# Patient Record
Sex: Female | Born: 2000 | Race: Asian | Hispanic: No | Marital: Single | Smoking: Current some day smoker
Health system: Southern US, Community
[De-identification: ages and names within clinical notes are randomized; demographics above are authoritative.]

## PROBLEM LIST (undated history)

## (undated) VITALS — BP 115/79 | HR 94 | Temp 98.4°F | Resp 18 | Ht 64.0 in | Wt 164.0 lb

---
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
## 2021-07-14 ENCOUNTER — Other Ambulatory Visit: Payer: Self-pay

## 2021-07-14 ENCOUNTER — Inpatient Hospital Stay (HOSPITAL_COMMUNITY)
Admission: RE | Admit: 2021-07-14 | Discharge: 2021-07-21 | DRG: 885 | Disposition: A | Discharge status: Home / Self Care | Payer: BLUE CROSS/BLUE SHIELD | Attending: Psychiatry | Admitting: Psychiatry

## 2021-07-14 ENCOUNTER — Encounter (HOSPITAL_COMMUNITY): Payer: Self-pay | Admitting: Psychiatry

## 2021-07-14 ENCOUNTER — Other Ambulatory Visit: Payer: Self-pay | Admitting: Psychiatry

## 2021-07-14 DIAGNOSIS — Z62810 Personal history of physical and sexual abuse in childhood: Secondary | ICD-10-CM | POA: Diagnosis present

## 2021-07-14 DIAGNOSIS — Z5986 Financial insecurity: Secondary | ICD-10-CM

## 2021-07-14 DIAGNOSIS — J45909 Unspecified asthma, uncomplicated: Secondary | ICD-10-CM | POA: Diagnosis present

## 2021-07-14 DIAGNOSIS — R45851 Suicidal ideations: Secondary | ICD-10-CM | POA: Diagnosis present

## 2021-07-14 DIAGNOSIS — F410 Panic disorder [episodic paroxysmal anxiety] without agoraphobia: Secondary | ICD-10-CM | POA: Diagnosis not present

## 2021-07-14 DIAGNOSIS — Z20822 Contact with and (suspected) exposure to covid-19: Secondary | ICD-10-CM | POA: Diagnosis present

## 2021-07-14 DIAGNOSIS — F603 Borderline personality disorder: Secondary | ICD-10-CM | POA: Diagnosis present

## 2021-07-14 DIAGNOSIS — Z823 Family history of stroke: Secondary | ICD-10-CM | POA: Diagnosis not present

## 2021-07-14 DIAGNOSIS — G4710 Hypersomnia, unspecified: Secondary | ICD-10-CM | POA: Diagnosis present

## 2021-07-14 DIAGNOSIS — G4700 Insomnia, unspecified: Secondary | ICD-10-CM | POA: Diagnosis present

## 2021-07-14 DIAGNOSIS — Z634 Disappearance and death of family member: Secondary | ICD-10-CM | POA: Diagnosis not present

## 2021-07-14 DIAGNOSIS — F250 Schizoaffective disorder, bipolar type: Principal | ICD-10-CM | POA: Diagnosis present

## 2021-07-14 DIAGNOSIS — F17210 Nicotine dependence, cigarettes, uncomplicated: Secondary | ICD-10-CM | POA: Diagnosis present

## 2021-07-14 DIAGNOSIS — E785 Hyperlipidemia, unspecified: Secondary | ICD-10-CM | POA: Diagnosis not present

## 2021-07-14 DIAGNOSIS — R41843 Psychomotor deficit: Secondary | ICD-10-CM | POA: Diagnosis present

## 2021-07-14 DIAGNOSIS — Z833 Family history of diabetes mellitus: Secondary | ICD-10-CM

## 2021-07-14 LAB — RESP PANEL BY RT-PCR (FLU A&B, COVID) ARPGX2
Influenza A by PCR: NEGATIVE
Influenza B by PCR: NEGATIVE
SARS Coronavirus 2 by RT PCR: NEGATIVE

## 2021-07-14 MED ORDER — ARIPIPRAZOLE 15 MG PO TABS
15.0000 mg | ORAL_TABLET | Freq: Every day | ORAL | Status: DC
  Administered 2021-07-14: 15 mg via ORAL
  Filled 2021-07-14: qty 1

## 2021-07-14 MED ORDER — TRAZODONE HCL 50 MG PO TABS
50.0000 mg | ORAL_TABLET | Freq: Every day | ORAL | Status: DC
  Administered 2021-07-14 – 2021-07-16 (×3): 50 mg via ORAL
  Filled 2021-07-14 (×4): qty 1

## 2021-07-14 MED ORDER — ARIPIPRAZOLE 15 MG PO TABS
ORAL_TABLET | ORAL | Status: AC
  Filled 2021-07-14: qty 1

## 2021-07-14 MED ORDER — MAGNESIUM HYDROXIDE 400 MG/5ML PO SUSP: 30.0000 mL | Freq: Every day | ORAL | Status: DC | PRN

## 2021-07-14 MED ORDER — HYDROXYZINE HCL 25 MG PO TABS
25.0000 mg | ORAL_TABLET | Freq: Three times a day (TID) | ORAL | Status: DC | PRN
  Administered 2021-07-15 – 2021-07-21 (×6): 25 mg via ORAL
  Filled 2021-07-14 (×6): qty 1
  Filled 2021-07-14: qty 21

## 2021-07-14 MED ORDER — ACETAMINOPHEN 325 MG PO TABS
650.0000 mg | ORAL_TABLET | Freq: Four times a day (QID) | ORAL | Status: DC | PRN
  Administered 2021-07-17 – 2021-07-18 (×2): 650 mg via ORAL
  Filled 2021-07-14 (×2): qty 2

## 2021-07-14 MED ORDER — NORGESTIM-ETH ESTRAD TRIPHASIC 0.18/0.215/0.25 MG-25 MCG PO TABS
1.0000 | ORAL_TABLET | Freq: Every day | ORAL | Status: DC
  Administered 2021-07-14 – 2021-07-15 (×2): 1 via ORAL

## 2021-07-14 NOTE — Progress Notes (Signed)
[facility] Group Notes:  (Nursing/MHT/Case Management/Adjunct)  Date:  07/14/2021  Time:  2015  Type of Therapy:   wrap up group  Participation Level:  Active  Participation Quality:  Appropriate, Attentive, Sharing, and Supportive  Affect:  Anxious  Cognitive:  Alert  Insight:  Improving  Engagement in Group:  Engaged  Modes of Intervention:  Clarification, Education, and Support  Summary of Progress/Problems: Positive thinking and positive change were discussed.   Marcille Buffy 07/14/2021, 9:36 PM

## 2021-07-14 NOTE — H&P (Signed)
Behavioral Health Medical Screening Exam  Megan Dunn is an 20 y.o. female with past history of schizoaffective disorder bipolar type, dissociative episodes, major depression disorder, AVH, inpatient hospitalization Megan Dunn 11/16/19). Currently receives outpatient psychiatric services via [facility] ([place]); Abilify 15 mg daily.   Per chart review, patient sent message to outpatient mental health provider stating she was "unwell mentally and we need to have them go to her house to check on her"; non-emergency line contacted to have officer to patient's residence for a well check. Patient didn't respond to email or calls from provider following wellness checks yesterday or today.   On assessment patient anxious and tearful. States she's not been able to get out of bed and unable to function. Patient is currently unemployed and states she is "struggling" to find employment due to her current mental state. States she has been thinking about listening to the voices telling her to kill herself. Patient states she does not feel comfortable alone and at home; unable to contract for safety. She is tearful and unable to answer many assessment questions; provided verbal permission for provider to speak with sister and boyfriend who are in the lobby for collateral information.  Collateral:  Sister Megan Dunn:  States she currently lives in [place] but patient has had "issues" since high school but initially when mother died while patient was in 5th grade. Says over Thanksgiving break she noticed patient was more "sensitive" and having several "breakdowns". Says at baseline patient is an extrovert with "bubbly" personality and large group of friends. Doesn't communicate well regarding mental health; family expectations are heavy on her. Patient has been experiencing paranoia where she believes things are in her closet. Feels very concerned given patient's current  presentation.   Boyfriend Megan Dunn:  States patient has been having self harm behaviors including scratching skin. Gets "really bad thoughts" when patient is alone; has safety concerns for patient at home. Patient has recently been saying she "wasn't sure she was going to make to Christmas". Endorses medication compliance; confirms x2 wellness checks by law enforcement as a result of providers contact. Says patient refused to contact provider after the fact.   Total Time spent with patient: 20 minutes  Psychiatric Specialty Exam: Physical Exam Review of Systems Blood pressure 113/72, pulse 78, resp. rate 18, SpO2 100 %.There is no height or weight on file to calculate BMI. General Appearance: Disheveled Eye Contact:  Fair Speech:  Normal Rate Volume:  Normal Mood:  Anxious and Hopeless Affect:  Depressed and Tearful Thought Process:  Coherent Orientation:  Full (Time, Place, and Person) Thought Content:  Paranoid Ideation Suicidal Thoughts:  Yes.  without intent/plan Homicidal Thoughts:  No Memory:  Immediate;   Fair Recent;   Fair Judgement:  Impaired Insight:  Present and Shallow Psychomotor Activity:  Increased Concentration: Concentration: Poor and Attention Span: Poor Recall:  Poor Fund of Knowledge:Good Language: Fair Akathisia:  NA Handed:  Right AIMS (if indicated):    Assets:  Desire for Improvement Financial Resources/Insurance Housing Intimacy Leisure Time Physical Health Resilience Social Support Sleep:     Musculoskeletal: Strength & Muscle Tone: within normal limits Gait & Station: normal Patient leans: N/A  Blood pressure 113/72, pulse 78, resp. rate 18, SpO2 100 %.  Recommendations: Based on my evaluation the patient does not appear to have an emergency medical condition. Patient recommended for inpatient hospitalization for further observation, stabilization, and treatment; accepted to [facility].   Loletta Parish, NP 07/14/2021, 3:52  PM

## 2021-07-14 NOTE — Progress Notes (Signed)
Patient is a 20 year old female who presented to [facility] as a walk-in accompanied by her boyfriend. Pt reported having an increase in suicidal ideation, and stated "I couldn't get out of bed throughout the day". Pt reported an increase in AH (command) and VH (shadowy figure that she calls "[place]"). Pt has a past hx of schizoaffective disorder bipolar type, and reports taking Abilify daily. Patient presented as childlike- becoming tearful when told she couldn't have her stuffed animal- was calm, and cooperative during admission interview and assessment. VS monitored and recorded. Skin check performed - old scars on right forearm from scratching herself.Admission paperwork completed and signed. Pt denied SI/HI and A/VH at the time of admission interview. Patient was oriented to unit and schedule. Pt provided with dinner . Q 15 min checks initiated for safety.

## 2021-07-14 NOTE — Tx Team (Signed)
Initial Treatment Plan 07/14/2021 8:45 PM Megan Dunn RFX:[id number]    PATIENT STRESSORS: Marital or family conflict   Other: mental heatlh     PATIENT STRENGTHS: Average or above average intelligence  Motivation for treatment/growth  Physical Health  Supportive family/friends    PATIENT IDENTIFIED PROBLEMS: Suicidal ideation  Self harm behaviors  A/V H    Increased depression             DISCHARGE CRITERIA:  Adequate post-discharge living arrangements Need for constant or close observation no longer present Reduction of life-threatening or endangering symptoms to within safe limits Verbal commitment to aftercare and medication compliance  PRELIMINARY DISCHARGE PLAN: Outpatient therapy Return to previous living arrangement  PATIENT/FAMILY INVOLVEMENT: This treatment plan has been presented to and reviewed with the patient, Megan Dunn,  The patient has been given the opportunity to ask questions and make suggestions.  Shela Nevin, RN 07/14/2021, 8:45 PM

## 2021-07-15 DIAGNOSIS — F250 Schizoaffective disorder, bipolar type: Principal | ICD-10-CM

## 2021-07-15 LAB — CBC
HCT: 41 % (ref 36.0–46.0)
Hemoglobin: 13 g/dL (ref 12.0–15.0)
MCH: 27 pg (ref 26.0–34.0)
MCHC: 31.7 g/dL (ref 30.0–36.0)
MCV: 85.2 fL (ref 80.0–100.0)
Platelets: 389 10*3/uL (ref 150–400)
RBC: 4.81 MIL/uL (ref 3.87–5.11)
RDW: 14 % (ref 11.5–15.5)
WBC: 12.3 10*3/uL — ABNORMAL HIGH (ref 4.0–10.5)
nRBC: 0 % (ref 0.0–0.2)

## 2021-07-15 LAB — COMPREHENSIVE METABOLIC PANEL
ALT: 12 U/L (ref 0–44)
AST: 14 U/L — ABNORMAL LOW (ref 15–41)
Albumin: 4.2 g/dL (ref 3.5–5.0)
Alkaline Phosphatase: 59 U/L (ref 38–126)
Anion gap: 8 (ref 5–15)
BUN: 12 mg/dL (ref 6–20)
CO2: 22 mmol/L (ref 22–32)
Calcium: 9.1 mg/dL (ref 8.9–10.3)
Chloride: 104 mmol/L (ref 98–111)
Creatinine, Ser: 0.72 mg/dL (ref 0.44–1.00)
GFR, Estimated: >60 mL/min (ref >60)
Glucose, Bld: 98 mg/dL (ref 70–99)
Potassium: 3.5 mmol/L (ref 3.5–5.1)
Sodium: 134 mmol/L — ABNORMAL LOW (ref 135–145)
Total Bilirubin: 0.4 mg/dL (ref 0.3–1.2)
Total Protein: 7.8 g/dL (ref 6.5–8.1)

## 2021-07-15 LAB — LIPID PANEL
Cholesterol: 214 mg/dL — ABNORMAL HIGH (ref 0–200)
HDL: 75 mg/dL (ref >40)
LDL Cholesterol: 107 mg/dL — ABNORMAL HIGH (ref 0–99)
Total CHOL/HDL Ratio: 2.9 RATIO
Triglycerides: 160 mg/dL — ABNORMAL HIGH (ref <150)
VLDL: 32 mg/dL (ref 0–40)

## 2021-07-15 LAB — RAPID URINE DRUG SCREEN, HOSP PERFORMED
Amphetamines: NOT DETECTED
Barbiturates: NOT DETECTED
Benzodiazepines: NOT DETECTED
Cocaine: NOT DETECTED
Opiates: NOT DETECTED
Tetrahydrocannabinol: NOT DETECTED

## 2021-07-15 LAB — TSH: TSH: 3.575 u[IU]/mL (ref 0.350–4.500)

## 2021-07-15 LAB — PREGNANCY, URINE: Preg Test, Ur: NEGATIVE

## 2021-07-15 MED ORDER — NICOTINE 21 MG/24HR TD PT24
21.0000 mg | MEDICATED_PATCH | Freq: Every day | TRANSDERMAL | Status: DC
  Administered 2021-07-15: 08:00:00 21 mg via TRANSDERMAL
  Filled 2021-07-15 (×2): qty 1

## 2021-07-15 MED ORDER — ZIPRASIDONE HCL 20 MG PO CAPS
20.0000 mg | ORAL_CAPSULE | Freq: Two times a day (BID) | ORAL | Status: DC
  Administered 2021-07-15 – 2021-07-18 (×6): 20 mg via ORAL
  Filled 2021-07-15 (×10): qty 1

## 2021-07-15 NOTE — Progress Notes (Addendum)
   07/15/21 1400  Psych Admission Type (Psych Patients Only)  Admission Status Voluntary  Psychosocial Assessment  Patient Complaints Anxiety;Depression  Eye Contact Brief  Facial Expression Flat  Affect Appropriate to circumstance  Speech Logical/coherent  Interaction Assertive  Motor Activity  (steady gait)  Appearance/Hygiene Unremarkable  Behavior Characteristics Cooperative;Appropriate to situation  Thought Process  Coherency WDL  Content WDL  Delusions None reported or observed  Perception WDL  Hallucination None reported or observed  Judgment Poor  Confusion None  Danger to Self  Current suicidal ideation? Denies  Danger to Others  Danger to Others None reported or observed     D. Pt presents with a sad affect/ anxious mood- calm, cooperative behavior on the unit. Pt currently denies SI/HI and AVH and agrees to contact staff before acting on any harmful thoughts. Per pt's self inventory, pt rated her depression, hopelessness and anxiety a 7/6/7, respectively. Pt reported that her goal today was to work on "being honest with my doctor and being confident".  A. Labs and vitals monitored. Pt given and educated on medications. Pt supported emotionally and encouraged to express concerns and ask questions.   R. Pt remains safe with 15 minute checks. Will continue POC.

## 2021-07-15 NOTE — [facility] Group Notes (Signed)
Adult Psychoeducational Group Not Date:  07/15/2021 Time:  0900-1045 Group Topic/Focus: PROGRESSIVE RELAXATION. A group where deep breathing is taught and tensing and relaxation muscle groups is used. Imagery is used as well.  Pts are asked to imagine 3 pillars that hold them up when they are not able to hold themselves up.  Participation Level:  Active  Participation Quality:  Appropriate  Affect:  Appropriate  Cognitive:  Oriented  Insight: Improving  Engagement in Group:  Engaged  Modes of Intervention:  Activity, Discussion, Education, and Support  Additional Comments:  Rates her energy level at a 4/10. States what holds her up is her Boyfriend her friends and her good faith in humanity  Megan Dunn, Megan Dunn A

## 2021-07-15 NOTE — Progress Notes (Signed)
   07/15/21 2334  Psych Admission Type (Psych Patients Only)  Admission Status Voluntary  Psychosocial Assessment  Patient Complaints Anxiety  Eye Contact Fair  Facial Expression Flat  Affect Appropriate to circumstance  Speech Logical/coherent;Soft  Interaction Assertive  Motor Activity Other (Comment) (WDL)  Appearance/Hygiene Unremarkable  Behavior Characteristics Appropriate to situation  Mood Anxious;Pleasant  Thought Process  Coherency WDL  Content WDL  Delusions None reported or observed  Perception WDL  Hallucination None reported or observed  Judgment Poor  Confusion None  Danger to Self  Current suicidal ideation? Denies  Danger to Others  Danger to Others None reported or observed

## 2021-07-15 NOTE — Group Note (Signed)
[facility] LCSW Group Therapy Note  Date/Time:  07/15/2021 10:00-11:00AM  Type of Therapy and Topic:  Group Therapy:  Healthy and Unhealthy Supports plus being "My Own Hero"  Participation Level:  Active   Description of Group: Patients in this group were invited to identify the differences between healthy and unhealthy supports and then to identify those people in their lives who fall into one category or the other, as well as why.  They were then introduced to the idea of adding more healthy supports and decreasing the unhealthy ones.  Patients discussed what additional healthy supports could be helpful in their recovery and wellness after discharge in order to maintain stability.   An emphasis was placed on using counselor, doctor, therapy groups, 12-step groups, and problem-specific support groups to expand supports.  The song "My Own Hero" was played to encourage full participation in their own recovery journey instead of expecting others to do all the work for them.  The song "I Know Where I've Been" was played as further encouragement that they are further in their journey than they were previously.  Therapeutic Goals:   1)  discuss importance of adding supports to stay well once out of the hospital  2)  compare healthy versus unhealthy supports and identify some examples of each  3)  generate ideas and descriptions of healthy supports that can be added  4)  offer mutual support about how to address unhealthy supports  5)  encourage active participation in and adherence to discharge plan    Summary of Patient Progress:  The patient stated that current healthy supports in their life are their friends and partner while current unhealthy supports include "picking her skin" and vaping. The patient expressed a willingness to add appropriate support(s) to help in their recovery journey.   Therapeutic Modalities:   Motivational Interviewing Brief Solution-Focused Therapy  Steve Rattler,  [place] 07/15/2021 1:58 PM

## 2021-07-15 NOTE — [facility] Suicide Risk Assessment (Signed)
[facility] Admission Suicide Risk Assessment   Nursing information obtained from:  Patient Demographic factors:  Adolescent or young adult, Caucasian, Low socioeconomic status, Unemployed Current Mental Status:  Suicidal ideation indicated by patient, Self-harm behaviors Loss Factors:  Decrease in vocational status Historical Factors:  Impulsivity Risk Reduction Factors:  Positive social support  Total Time spent with patient: 30 minutes Principal Problem: Schizoaffective disorder, bipolar type ([place]) Diagnosis:  Principal Problem:   Schizoaffective disorder, bipolar type ([place])  Subjective Data: Megan Dunn is a 20 year old with a past psychiatric history of schizoaffective disorder who presented as a voluntary walking to [facility] endorsing worsening mood as well as auditory and visual hallucinations.  She had to quit her job because the hallucinations were interfering with her ability to perform, and she has not been able to function enough to get another one. She is worried about making her bills. She is not sleeping well. She notes that this happens about the same time of year every year. She has been seeing shadows since she was a child and named them "Mechele Claude" to make them less scary. She hears voices of a crowd but they are more negative and tell her to do bad things. She denies thoughts of wanting to die. She worries that the air molecules will change and she will choke to death.   Continued Clinical Symptoms:    The "Alcohol Use Disorders Identification Test", Guidelines for Use in Primary Care, Second Edition.  World Pharmacologist [facility]). Score between 0-7:  no or low risk or alcohol related problems. Score between 8-15:  moderate risk of alcohol related problems. Score between 16-19:  high risk of alcohol related problems. Score 20 or above:  warrants further diagnostic evaluation for alcohol dependence and treatment.   CLINICAL FACTORS:   Schizophrenia:   Command  hallucinatons Depressive state Less than 18 years old More than one psychiatric diagnosis Currently Psychotic   Musculoskeletal: Strength & Muscle Tone: within normal limits Gait & Station: normal Patient leans: N/A  Psychiatric Specialty Exam:  Presentation  General Appearance: Appropriate for Environment  Eye Contact:Fleeting  Speech:Clear and Coherent  Speech Volume:Decreased  Handedness:No data recorded  Mood and Affect  Mood:Dysphoric  Affect:Flat   Thought Process  Thought Processes:Coherent  Descriptions of Associations:Intact  Orientation:Full (Time, Place and Person)  Thought Content:Logical  History of Schizophrenia/Schizoaffective disorder:No data recorded Duration of Psychotic Symptoms:No data recorded Hallucinations:Hallucinations: Auditory; Visual Description of Auditory Hallucinations: voices having conversation and sometimes tell her to do stuff Description of Visual Hallucinations: see's a shadowy figure she has named Mechele Claude, last saw yesterday  Ideas of Reference:None  Suicidal Thoughts:Suicidal Thoughts: No  Homicidal Thoughts:Homicidal Thoughts: No   Sensorium  Memory:Immediate Good; Recent Good; Remote Good  Judgment:Fair  Insight:Shallow   Executive Functions  Concentration:Good  Attention Span:Fair  [place]  Language:Good   Psychomotor Activity  Psychomotor Activity:Psychomotor Activity: Decreased   Assets  Assets:Communication Skills; Housing; Data processing manager; Resilience; Intimacy   Sleep  Sleep:Sleep: Poor    Physical Exam: Physical Exam Vitals and nursing note reviewed.  Constitutional:      Appearance: Normal appearance.  HENT:     Head: Normocephalic.  Eyes:     Extraocular Movements: Extraocular movements intact.  Pulmonary:     Effort: Pulmonary effort is normal.  Musculoskeletal:        General: Normal range of motion.     Cervical back: Normal range  of motion.  Neurological:     General: No  focal deficit present.     Mental Status: She is alert and oriented to person, place, and time.  Psychiatric:        Attention and Perception: Attention normal. She perceives auditory and visual hallucinations.        Mood and Affect: Mood is depressed. Affect is blunt.        Speech: Speech normal.        Behavior: Behavior normal. Behavior is cooperative.        Thought Content: Thought content is paranoid and delusional. Thought content does not include homicidal or suicidal ideation. Thought content does not include homicidal or suicidal plan.        Cognition and Memory: Cognition and memory normal.        Judgment: Judgment is impulsive.   Review of Systems  Constitutional:  Negative for fever.  HENT:  Negative for hearing loss.   Eyes:  Negative for blurred vision.  Respiratory:  Negative for cough.   Cardiovascular:  Negative for chest pain.  Gastrointestinal:  Negative for constipation, diarrhea, nausea and vomiting.  Genitourinary:  Negative for dysuria.  Musculoskeletal:  Negative for myalgias.  Skin:  Negative for rash.  Neurological:  Positive for dizziness.  Psychiatric/Behavioral:  Positive for depression and hallucinations. Negative for memory loss, substance abuse and suicidal ideas. The patient is nervous/anxious and has insomnia.   Blood pressure 125/80, pulse 91, temperature 98.2 F (36.8 C), temperature source Oral, resp. rate 16, height 5\' 4"  (1.626 m), weight 74.4 kg, SpO2 100 %. Body mass index is 28.15 kg/m.   COGNITIVE FEATURES THAT CONTRIBUTE TO RISK:  None    SUICIDE RISK:   Moderate:  Frequent suicidal ideation with limited intensity, and duration, some specificity in terms of plans, no associated intent, good self-control, limited dysphoria/symptomatology, some risk factors present, and identifiable protective factors, including available and accessible social support.  PLAN OF CARE: I have read below and  discuss with resident. Edits made to reflect my own interview and interactions with patient today.   Safety and Monitoring --  Admission to inpatient psychiatric unit for safety, stabilization and treatment -- Daily contact with patient to assess and evaluate symptoms and progress in treatment -- Patient's case to be discussed in multi-disciplinary team meeting. -- Patient will be encouraged to participate in the therapeutic group milieu. -- Observation Level : q15 minute checks -- Vital signs:  q12 hours -- Precautions: suicide.  Plan  -Monitor Vitals. -Monitor for thoughts of harm to self or others -Monitor for psychosis, disorganization or changes to cognition -Monitor for withdrawal symptoms. -Monitor for medication side effects.  Labs/Studies: Lipids, A1c  Medications: Trial of Geodon. Patient has failed Abilify. Will try Geodon (less weight gain) for mood stabilization and psychosis PRNS for anxiety, sleep, indigestion Continue birst control NRT for smoking cessation   I certify that inpatient services furnished can reasonably be expected to improve the patient's condition.   , MD 07/15/2021, 2:57 PM
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
## 2021-07-15 NOTE — Progress Notes (Signed)
   07/15/21 0018  Psych Admission Type (Psych Patients Only)  Admission Status Voluntary  Psychosocial Assessment  Patient Complaints Anxiety  Eye Contact Brief  Facial Expression Flat  Affect Appropriate to circumstance  Speech Logical/coherent  Interaction Assertive  Appearance/Hygiene Unremarkable  Behavior Characteristics Appropriate to situation  Mood Anxious  Thought Process  Coherency WDL  Content WDL  Delusions None reported or observed  Perception WDL  Hallucination None reported or observed  Judgment Poor  Confusion None  Danger to Self  Current suicidal ideation? Denies  Danger to Others  Danger to Others None reported or observed

## 2021-07-16 ENCOUNTER — Encounter (HOSPITAL_COMMUNITY): Payer: Self-pay

## 2021-07-16 LAB — HEMOGLOBIN A1C
Hgb A1c MFr Bld: 5.4 % (ref 4.8–5.6)
Mean Plasma Glucose: 108.28 mg/dL

## 2021-07-16 MED ORDER — NORGESTIM-ETH ESTRAD TRIPHASIC 0.18/0.215/0.25 MG-25 MCG PO TABS
1.0000 | ORAL_TABLET | Freq: Every day | ORAL | Status: DC
Start: 2021-07-16 — End: 2021-07-21
  Administered 2021-07-16 – 2021-07-20 (×5): 1 via ORAL

## 2021-07-16 MED ORDER — NICOTINE POLACRILEX 2 MG MT GUM: 2.0000 mg | CHEWING_GUM | OROMUCOSAL | Status: DC | PRN

## 2021-07-16 NOTE — Plan of Care (Signed)
  Problem: Education: Goal: Knowledge of [facility] Education information/materials will improve Outcome: Progressing Goal: Emotional status will improve Outcome: Progressing Goal: Mental status will improve Outcome: Progressing   

## 2021-07-16 NOTE — BH IP Treatment Plan (Signed)
Interdisciplinary Treatment and Diagnostic Plan Update  07/16/2021 Time of Session: 9:55am Megan Dunn MRN: [id number]  Principal Diagnosis: Schizoaffective disorder, bipolar type ([place])  Secondary Diagnoses: Principal Problem:   Schizoaffective disorder, bipolar type ([place])   Current Medications:  Current Facility-Administered Medications  Medication Dose Route Frequency Provider Last Rate Last Admin   acetaminophen (TYLENOL) tablet 650 mg  650 mg Oral Q6H PRN Bobbitt, Shalon E, NP       hydrOXYzine (ATARAX) tablet 25 mg  25 mg Oral TID PRN Bobbitt, Shalon E, NP   25 mg at 07/15/21 2234   magnesium hydroxide (MILK OF MAGNESIA) suspension 30 mL  30 mL Oral Daily PRN Leevy-Johnson, Brooke A, NP       nicotine polacrilex (NICORETTE) gum 2 mg  2 mg Oral PRN Freida Busman, MD       Norgestimate-Ethinyl Estradiol Triphasic 0.18/0.215/0.25 MG-25 MCG tablet 1 tablet  1 tablet Oral Daily Bobbitt, Shalon E, NP   1 tablet at 07/15/21 2142   traZODone (DESYREL) tablet 50 mg  50 mg Oral QHS Bobbitt, Shalon E, NP   50 mg at 07/15/21 2141   ziprasidone (GEODON) capsule 20 mg  20 mg Oral BID WC Damita Dunnings B, MD   20 mg at 07/15/21 1811   PTA Medications: Medications Prior to Admission  Medication Sig Dispense Refill Last Dose   albuterol (VENTOLIN HFA) 108 (90 Base) MCG/ACT inhaler Inhale 2 puffs into the lungs every 6 (six) hours as needed.      ARIPiprazole (ABILIFY) 15 MG tablet Take 1 tablet by mouth daily.      Norgestimate-Ethinyl Estradiol Triphasic 0.18/0.215/0.25 MG-25 MCG tab Take 1 tablet by mouth daily.       Patient Stressors: Marital or family conflict   Other: mental heatlh    Patient Strengths: Average or above average intelligence  Motivation for treatment/growth  Physical Health  Supportive family/friends   Treatment Modalities: Medication Management, Group therapy, Case management,  1 to 1 session with clinician, Psychoeducation, Recreational therapy.   Physician  Treatment Plan for Primary Diagnosis: Schizoaffective disorder, bipolar type ([place]) Long Term Goal(s): Improvement in symptoms so as ready for discharge   Short Term Goals: Ability to identify changes in lifestyle to reduce recurrence of condition will improve Ability to verbalize feelings will improve Ability to disclose and discuss suicidal ideas Ability to demonstrate self-control will improve Ability to identify and develop effective coping behaviors will improve Ability to maintain clinical measurements within normal limits will improve Ability to identify triggers associated with substance abuse/mental health issues will improve  Medication Management: Evaluate patient's response, side effects, and tolerance of medication regimen.  Therapeutic Interventions: 1 to 1 sessions, Unit Group sessions and Medication administration.  Evaluation of Outcomes: Not Met  Physician Treatment Plan for Secondary Diagnosis: Principal Problem:   Schizoaffective disorder, bipolar type ([place])  Long Term Goal(s): Improvement in symptoms so as ready for discharge   Short Term Goals: Ability to identify changes in lifestyle to reduce recurrence of condition will improve Ability to verbalize feelings will improve Ability to disclose and discuss suicidal ideas Ability to demonstrate self-control will improve Ability to identify and develop effective coping behaviors will improve Ability to maintain clinical measurements within normal limits will improve Ability to identify triggers associated with substance abuse/mental health issues will improve     Medication Management: Evaluate patient's response, side effects, and tolerance of medication regimen.  Therapeutic Interventions: 1 to 1 sessions, Unit Group sessions and Medication administration.  Evaluation  of Outcomes: Not Met   RN Treatment Plan for Primary Diagnosis: Schizoaffective disorder, bipolar type ([place]) Long Term Goal(s): Knowledge of  disease and therapeutic regimen to maintain health will improve  Short Term Goals: Ability to remain free from injury will improve, Ability to verbalize frustration and anger appropriately will improve, Ability to demonstrate self-control, Ability to identify and develop effective coping behaviors will improve, and Compliance with prescribed medications will improve  Medication Management: RN will administer medications as ordered by provider, will assess and evaluate patient's response and provide education to patient for prescribed medication. RN will report any adverse and/or side effects to prescribing provider.  Therapeutic Interventions: 1 on 1 counseling sessions, Psychoeducation, Medication administration, Evaluate responses to treatment, Monitor vital signs and CBGs as ordered, Perform/monitor CIWA, COWS, AIMS and Fall Risk screenings as ordered, Perform wound care treatments as ordered.  Evaluation of Outcomes: Not Met   LCSW Treatment Plan for Primary Diagnosis: Schizoaffective disorder, bipolar type ([place]) Long Term Goal(s): Safe transition to appropriate next level of care at discharge, Engage patient in therapeutic group addressing interpersonal concerns.  Short Term Goals: Engage patient in aftercare planning with referrals and resources, Increase social support, Increase ability to appropriately verbalize feelings, Increase emotional regulation, Identify triggers associated with mental health/substance abuse issues, and Increase skills for wellness and recovery  Therapeutic Interventions: Assess for all discharge needs, 1 to 1 time with Social worker, Explore available resources and support systems, Assess for adequacy in community support network, Educate family and significant other(s) on suicide prevention, Complete Psychosocial Assessment, Interpersonal group therapy.  Evaluation of Outcomes: Not Met   Progress in Treatment: Attending groups: Yes. Participating in groups:  Yes. Taking medication as prescribed: Yes. Toleration medication: Yes. Family/Significant other contact made: No, will contact:  boyfriend Patient understands diagnosis: Yes. Discussing patient identified problems/goals with staff: Yes. Medical problems stabilized or resolved: Yes. Denies suicidal/homicidal ideation: Yes. Issues/concerns per patient self-inventory: No.  New problem(s) identified: No, Describe:  none  New Short Term/Long Term Goal(s): medication stabilization, elimination of SI thoughts, development of comprehensive mental wellness plan.    Patient Goals:  "To get my medicine fixed and to be able to work again"  Discharge Plan or Barriers: Patient recently admitted. CSW will continue to follow and assess for appropriate referrals and possible discharge planning.    Reason for Continuation of Hospitalization: Anxiety Depression Medication stabilization Suicidal ideation  Estimated Length of Stay: 3-5 days   Scribe for Treatment Team: Vassie Moselle, LCSW 07/16/2021 10:52 AM

## 2021-07-16 NOTE — Group Note (Signed)
LCSW Group Therapy Note   Group Date: 07/16/2021 Start Time: 1300 End Time: 1400   Type of Therapy and Topic:  Group Therapy: Boundaries  Participation Level:  Active  Description of Group: This group will address the use of boundaries in their personal lives. Patients will explore why boundaries are important, the difference between healthy and unhealthy boundaries, and negative and postive outcomes of different boundaries and will look at how boundaries can be crossed.  Patients will be encouraged to identify current boundaries in their own lives and identify what kind of boundary is being set. Facilitators will guide patients in utilizing problem-solving interventions to address and correct types boundaries being used and to address when no boundary is being used. Understanding and applying boundaries will be explored and addressed for obtaining and maintaining a balanced life. Patients will be encouraged to explore ways to assertively make their boundaries and needs known to significant others in their lives, using other group members and facilitator for role play, support, and feedback.  Therapeutic Goals:  1.  Patient will identify areas in their life where setting clear boundaries could be  used to improve their life.  2.  Patient will identify signs/triggers that a boundary is not being respected. 3.  Patient will identify two ways to set boundaries in order to achieve balance in  their lives: 4.  Patient will demonstrate ability to communicate their needs and set boundaries  through discussion and/or role plays  Summary of Patient Progress:  Lonisha was present/active throughout the session and proved open to feedback from CSW and peers. Patient demonstrated good insight into the subject matter, was respectful of peers, and was present throughout the entire session.  Therapeutic Modalities:   Cognitive Behavioral Therapy Solution-Focused Therapy  Beatris Si,  LCSW 07/16/2021  2:59 PM

## 2021-07-16 NOTE — Group Note (Signed)
Recreation Therapy Group Note   Group Topic:Stress Management  Group Date: 07/16/2021 Start Time: 0931 End Time: 0945 Facilitators: Caroll Rancher, LRT,CTRS Location: [address] Dayroom   Goal Area(s) Addresses:  Patient will actively participate in stress management techniques presented during session.  Patient will successfully identify benefit of practicing stress management post d/c.   Group Description: Guided Imagery. LRT provided education, instruction, and demonstration on practice of visualization via guided imagery. Patient was asked to participate in the technique introduced during session. LRT debriefed including topics of mindfulness, stress management and specific scenarios each patient could use these techniques. Patients were given suggestions of ways to access scripts post d/c and encouraged to explore Youtube and other apps available on smartphones, tablets, and computers.   Affect/Mood: N/A   Participation Level: Did not attend    Clinical Observations/Individualized Feedback:     Plan: Continue to engage patient in RT group sessions 2-3x/week.   Caroll Rancher, LRT,CTRS 07/16/2021 11:43 AM

## 2021-07-16 NOTE — [facility] Group Notes (Signed)
Patient did not attend group.

## 2021-07-16 NOTE — Progress Notes (Signed)
[facility] MD Progress Note  07/16/2021 4:14 PM Megan Dunn  MRN:  [id number] Subjective:  Megan Dunn is a 20 year old with a past psychiatric history of schizoaffective disorder who presented as a voluntary walking to [facility] endorsing worsening mood as well as auditory and visual hallucinations.  Case was discussed in the multidisciplinary team. MAR was reviewed and patient was compliant with medications.  She did not require any PRN's for agitation.     Psychiatric Team made the following recommendations yesterday:  - Start Geodon 20mg  BID w/ meals titrate up as tolerated -Discontinue Abilify 15 mg  On assessment this AM patient was drowsy, but after exam patient was noted to leave her room and interact well with others on the unit. Patient reports that she had aa "Dream loop" and was noted to say "help" a hypophonic voice, when provider woke her up. Patient endorsed that she often has loops of dreams that continues over and over again, after she wakes up in the night. Patient reports that this is a chronic problem, but endorses that she feels she slept well last night. Patient endorses that she still feels depressed and finds it difficult to come up with a goal. Patient endorses that she wants to get out of bed, shower, eat and stay out of her room and try to get her medications "fixed." Patient denies SI, HI, and AVH.  Patient reports that she feels the urge to self-harm is lower and endorses that it would help if she could not see her scabs.  Principal Problem: Schizoaffective disorder, bipolar type (HCC) Diagnosis: Principal Problem:   Schizoaffective disorder, bipolar type (HCC)  Total Time spent with patient: 15 minutes  Past Psychiatric History: Se H&P  Past Medical History: History reviewed. No pertinent past medical history.  Past Surgical History:  Procedure Laterality Date   INCISION AND DRAINAGE ABSCESS N/A 01/23/2017   Procedure: DRAINAGE ABSCESS;  Surgeon: 01/25/2017, MD;   Location: [facility] OR;  Service: General;  Laterality: N/A;   LAPAROSCOPIC APPENDECTOMY N/A 01/18/2017   Procedure: APPENDECTOMY LAPAROSCOPIC;  Surgeon: 03/20/2017, MD;  Location: [facility] OR;  Service: General;  Laterality: N/A;   LAPAROTOMY N/A 01/23/2017   Procedure: EXPLORATORY LAPAROTOMY;  Surgeon: 01/25/2017, MD;  Location: [facility] OR;  Service: General;  Laterality: N/A;   LYSIS OF ADHESION N/A 01/23/2017   Procedure: LYSIS OF ADHESION;  Surgeon: 01/25/2017, MD;  Location: [facility] OR;  Service: General;  Laterality: N/A;   Family History:  Family History  Problem Relation Age of Onset   Aneurysm Mother        brain   Diabetes Maternal Grandmother    Stroke Maternal Grandfather    Family Psychiatric  History: See H&P Social History:  Social History   Substance and Sexual Activity  Alcohol Use No     Social History   Substance and Sexual Activity  Drug Use Not on file    Social History   Socioeconomic History   Marital status: Single    Spouse name: Not on file   Number of children: Not on file   Years of education: Not on file   Highest education level: Not on file  Occupational History   Not on file  Tobacco Use   Smoking status: Some Days    Packs/day: 0.25    Years: 1.00    Pack years: 0.25    Types: Cigarettes   Smokeless tobacco: Never  Vaping Use   Vaping Use: Every  day  Substance and Sexual Activity   Alcohol use: No   Drug use: Not on file   Sexual activity: Not on file  Other Topics Concern   Not on file  Social History Narrative   Not on file   Social Determinants of Health   Financial Resource Strain: Not on file  Food Insecurity: Not on file  Transportation Needs: Not on file  Physical Activity: Not on file  Stress: Not on file  Social Connections: Not on file   Additional Social History:                         Sleep: Fair  Appetite:  Good  Current Medications: Current Facility-Administered Medications  Medication Dose Route  Frequency Provider Last Rate Last Admin   acetaminophen (TYLENOL) tablet 650 mg  650 mg Oral Q6H PRN Bobbitt, Shalon E, NP       hydrOXYzine (ATARAX) tablet 25 mg  25 mg Oral TID PRN Bobbitt, Shalon E, NP   25 mg at 07/15/21 2234   magnesium hydroxide (MILK OF MAGNESIA) suspension 30 mL  30 mL Oral Daily PRN Leevy-Johnson, Brooke A, NP       nicotine polacrilex (NICORETTE) gum 2 mg  2 mg Oral PRN Bobbye Morton, MD       Norgestimate-Ethinyl Estradiol Triphasic 0.18/0.215/0.25 MG-25 MCG tablet 1 tablet  1 tablet Oral Daily Massengill, Nathan, MD       traZODone (DESYREL) tablet 50 mg  50 mg Oral QHS Bobbitt, Shalon E, NP   50 mg at 07/15/21 2141   ziprasidone (GEODON) capsule 20 mg  20 mg Oral BID WC Eliseo Gum B, MD   20 mg at 07/16/21 1426    Lab Results:  Results for orders placed or performed during the hospital encounter of 07/14/21 (from the past 48 hour(s))  Resp Panel by RT-PCR (Flu A&B, Covid) Nasopharyngeal Swab     Status: None   Collection Time: 07/14/21  4:29 PM   Specimen: Nasopharyngeal Swab; Nasopharyngeal(NP) swabs in vial transport medium  Result Value Ref Range   SARS Coronavirus 2 by RT PCR NEGATIVE NEGATIVE    Comment: (NOTE) SARS-CoV-2 target nucleic acids are NOT DETECTED.  The SARS-CoV-2 RNA is generally detectable in upper respiratory specimens during the acute phase of infection. The lowest concentration of SARS-CoV-2 viral copies this assay can detect is 138 copies/mL. A negative result does not preclude SARS-Cov-2 infection and should not be used as the sole basis for treatment or other patient management decisions. A negative result may occur with  improper specimen collection/handling, submission of specimen other than nasopharyngeal swab, presence of viral mutation(s) within the areas targeted by this assay, and inadequate number of viral copies(<138 copies/mL). A negative result must be combined with clinical observations, patient history, and  epidemiological information. The expected result is Negative.  Fact Sheet for Patients:  BloggerCourse.com  Fact Sheet for Healthcare Providers:  SeriousBroker.it  This test is no t yet approved or cleared by the [place] FDA and  has been authorized for detection and/or diagnosis of SARS-CoV-2 by FDA under an Emergency Use Authorization (EUA). This EUA will remain  in effect (meaning this test can be used) for the duration of the COVID-19 declaration under Section 564(b)(1) of the Act, 21 U.S.C.section 360bbb-3(b)(1), unless the authorization is terminated  or revoked sooner.       Influenza A by PCR NEGATIVE NEGATIVE   Influenza B by PCR NEGATIVE NEGATIVE  Comment: (NOTE) The Xpert Xpress SARS-CoV-2/FLU/RSV plus assay is intended as an aid in the diagnosis of influenza from Nasopharyngeal swab specimens and should not be used as a sole basis for treatment. Nasal washings and aspirates are unacceptable for Xpert Xpress SARS-CoV-2/FLU/RSV testing.  Fact Sheet for Patients: BloggerCourse.com  Fact Sheet for Healthcare Providers: SeriousBroker.it  This test is not yet approved or cleared by the [place] FDA and has been authorized for detection and/or diagnosis of SARS-CoV-2 by FDA under an Emergency Use Authorization (EUA). This EUA will remain in effect (meaning this test can be used) for the duration of the COVID-19 declaration under Section 564(b)(1) of the Act, 21 U.S.C. section 360bbb-3(b)(1), unless the authorization is terminated or revoked.  Performed at [facility], [address] [place], [place] [address]   Comprehensive metabolic panel     Status: Abnormal   Collection Time: 07/15/21  6:18 AM  Result Value Ref Range   Sodium 134 (L) 135 - 145 mmol/L   Potassium 3.5 3.5 - 5.1 mmol/L   Chloride 104 98 - 111 mmol/L   CO2 22 22 -  32 mmol/L   Glucose, Bld 98 70 - 99 mg/dL    Comment: Glucose reference range applies only to samples taken after fasting for at least 8 hours.   BUN 12 6 - 20 mg/dL   Creatinine, Ser 1.91 0.44 - 1.00 mg/dL   Calcium 9.1 8.9 - 47.8 mg/dL   Total Protein 7.8 6.5 - 8.1 g/dL   Albumin 4.2 3.5 - 5.0 g/dL   AST 14 (L) 15 - 41 U/L   ALT 12 0 - 44 U/L   Alkaline Phosphatase 59 38 - 126 U/L   Total Bilirubin 0.4 0.3 - 1.2 mg/dL   GFR, Estimated >29 >56 mL/min    Comment: (NOTE) Calculated using the CKD-EPI Creatinine Equation (2021)    Anion gap 8 5 - 15    Comment: Performed at [facility], [address], [place], [place] [address]  CBC     Status: Abnormal   Collection Time: 07/15/21  6:18 AM  Result Value Ref Range   WBC 12.3 (H) 4.0 - 10.5 K/uL   RBC 4.81 3.87 - 5.11 MIL/uL   Hemoglobin 13.0 12.0 - 15.0 g/dL   HCT 65.7 84.6 - 96.2 %   MCV 85.2 80.0 - 100.0 fL   MCH 27.0 26.0 - 34.0 pg   MCHC 31.7 30.0 - 36.0 g/dL   RDW 95.2 84.1 - 32.4 %   Platelets 389 150 - 400 K/uL   nRBC 0.0 0.0 - 0.2 %    Comment: Performed at [facility], [address] [place], [place] [address]  Lipid panel     Status: Abnormal   Collection Time: 07/15/21  6:18 AM  Result Value Ref Range   Cholesterol 214 (H) 0 - 200 mg/dL   Triglycerides 725 (H) <150 mg/dL   HDL 75 >36 mg/dL   Total CHOL/HDL Ratio 2.9 RATIO   VLDL 32 0 - 40 mg/dL   LDL Cholesterol 644 (H) 0 - 99 mg/dL    Comment:        Total Cholesterol/HDL:CHD Risk Coronary Heart Disease Risk Table                     Men   Women  1/2 Average Risk   3.4   3.3  Average Risk       5.0  4.4  2 X Average Risk   9.6   7.1  3 X Average Risk  23.4   11.0        Use the calculated Patient Ratio above and the CHD Risk Table to determine the patient's CHD Risk.        ATP III CLASSIFICATION (LDL):  <100     mg/dL   Optimal  176-160  mg/dL   Near or Above                    Optimal  130-159  mg/dL    Borderline  737-106  mg/dL   High  >269     mg/dL   Very High Performed at [facility] [address] [place], [place] [address]   TSH     Status: None   Collection Time: 07/15/21  6:18 AM  Result Value Ref Range   TSH 3.575 0.350 - 4.500 uIU/mL    Comment: Performed by a 3rd Generation assay with a functional sensitivity of <=0.01 uIU/mL. Performed at [facility], [address], [place], [place] [address]   Urine rapid drug screen (hosp performed)not at [facility]     Status: None   Collection Time: 07/15/21  6:45 AM  Result Value Ref Range   Opiates NONE DETECTED NONE DETECTED   Cocaine NONE DETECTED NONE DETECTED   Benzodiazepines NONE DETECTED NONE DETECTED   Amphetamines NONE DETECTED NONE DETECTED   Tetrahydrocannabinol NONE DETECTED NONE DETECTED   Barbiturates NONE DETECTED NONE DETECTED    Comment: (NOTE) DRUG SCREEN FOR MEDICAL PURPOSES ONLY.  IF CONFIRMATION IS NEEDED FOR ANY PURPOSE, NOTIFY LAB WITHIN 5 DAYS.  LOWEST DETECTABLE LIMITS FOR URINE DRUG SCREEN Drug Class                     Cutoff (ng/mL) Amphetamine and metabolites    1000 Barbiturate and metabolites    200 Benzodiazepine                 200 Tricyclics and metabolites     300 Opiates and metabolites        300 Cocaine and metabolites        300 THC                            50 Performed at [facility], [address], [place], [place] [address]   Pregnancy, urine     Status: None   Collection Time: 07/15/21  6:45 AM  Result Value Ref Range   Preg Test, Ur NEGATIVE NEGATIVE    Comment:        THE SENSITIVITY OF THIS METHODOLOGY IS >20 mIU/mL. Performed at [facility], [address] [place], [place] [address]   Hemoglobin A1c     Status: None   Collection Time: 07/15/21  6:40 PM  Result Value Ref Range   Hgb A1c MFr Bld 5.4 4.8 - 5.6 %    Comment: (NOTE) Pre diabetes:          5.7%-6.4%  Diabetes:               >6.4%  Glycemic control for   <7.0% adults with diabetes    Mean Plasma Glucose 108.28 mg/dL    Comment: Performed at [facility], [address] [place], [place] [address]    Blood Alcohol level:  No results  found for: [facility]  Metabolic Disorder Labs: Lab Results  Component Value Date   HGBA1C 5.4 07/15/2021   MPG 108.28 07/15/2021   No results found for: PROLACTIN Lab Results  Component Value Date   CHOL 214 (H) 07/15/2021   TRIG 160 (H) 07/15/2021   HDL 75 07/15/2021   CHOLHDL 2.9 07/15/2021   VLDL 32 07/15/2021   LDLCALC 107 (H) 07/15/2021    Physical Findings: AIMS: Facial and Oral Movements Muscles of Facial Expression: None, normal Lips and Perioral Area: None, normal Jaw: None, normal Tongue: None, normal,Extremity Movements Upper (arms, wrists, hands, fingers): None, normal Lower (legs, knees, ankles, toes): None, normal, Trunk Movements Neck, shoulders, hips: None, normal, Overall Severity Severity of abnormal movements (highest score from questions above): None, normal Incapacitation due to abnormal movements: None, normal Patient's awareness of abnormal movements (rate only patient's report): No Awareness, Dental Status Current problems with teeth and/or dentures?: No Does patient usually wear dentures?: No  CIWA:    COWS:     Musculoskeletal: Strength & Muscle Tone: within normal limits Gait & Station: normal Patient leans: N/A  Psychiatric Specialty Exam:  Presentation  General Appearance: Bizarre (eyes slightly open walking around very slowly)  Eye Contact:Fair  Speech:Slow  Speech Volume:Decreased (almost hypophonic)  Handedness:No data recorded  Mood and Affect  Mood:Dysphoric  Affect:Flat   Thought Process  Thought Processes:Coherent  Descriptions of Associations:Circumstantial  Orientation:Full (Time, Place and Person)  Thought Content:Logical  History of Schizophrenia/Schizoaffective disorder:No data  recorded Duration of Psychotic Symptoms:No data recorded Hallucinations:Hallucinations: Auditory Description of Auditory Hallucinations: the voices are still in my head Description of Visual Hallucinations: see's a shadowy figure she has named [name], last saw yesterday  Ideas of Reference:None  Suicidal Thoughts:Suicidal Thoughts: No  Homicidal Thoughts:Homicidal Thoughts: No   Sensorium  Memory:Immediate Good; Recent Good  Judgment:Fair  Insight:Shallow   Executive Functions  Concentration:Fair  Attention Span:Fair  Recall:No data recorded Fund of Knowledge:Good  Language:Good   Psychomotor Activity  Psychomotor Activity:Psychomotor Activity: Psychomotor Retardation   Assets  Assets:Communication Skills; Housing; Resilience   Sleep  Sleep:Sleep: Fair    Physical Exam: Physical Exam HENT:     Head: Normocephalic and atraumatic.  Pulmonary:     Effort: Pulmonary effort is normal.  Neurological:     Mental Status: She is alert and oriented to person, place, and time.   Review of Systems  Constitutional:  Positive for malaise/fatigue.  Psychiatric/Behavioral:  Positive for depression and hallucinations. Negative for suicidal ideas.   Blood pressure 118/75, pulse 96, temperature 98.2 F (36.8 C), temperature source Oral, resp. rate 16, height  (1.626 m), weight 74.4 kg, SpO2 99 %. Body mass index is 28.15 kg/m.   Treatment Plan Summary: Daily contact with patient to assess and evaluate symptoms and progress in treatment and Medication management Lizabeth Fellner is a 20 year old patient with a past psychiatric history of schizoaffective disorder, bipolar type and borderline personality disorder.  Patient's presentation was a bit odd today. At this time remain unsure if patient's sedated appearance was due to be drowsy from the time, her medication, or if feeling helpless. There is some concern that patient's Borderline Personality is interfering; and  patient appears to need more encouragement to get out of bed. However, patient does stay out of bed and interacts well with continued support from staff. Will continue to monitor, patient may be adjusting to Geodon.  Schizoaffective disorder, bipolar type (R/O PTSD, MDD with psychotic features, bipolar disorder with psychotic features) Borderline personality  disorder - Continue Geodon 20mg  BID w/ meals titrate up as tolerated - Encourage patient to interact with others in the milieu as well as attend group therapy sessions - SW consult for dispo planning including therapy and psychiatric outpatient care, patient endorsed that she is no longer able to see her outpatient therapist due to insurance   PRN -Tylenol 650mg  q6h, pain -Maalox 57ml q4h, indigestion -Atarax 25mg  TID, anxiety -Milk of Mag 25mL, constipation -Trazodone 50mg  QHS, insomnia    Safety, observation: Recommend q. 15 checks routine   PGY-2 31m, MD 07/16/2021, 4:14 PM

## 2021-07-16 NOTE — Group Note (Signed)
Occupational Therapy Group Note  Group Topic:Feelings Management  Group Date: 07/16/2021 Start Time: 1400 End Time: 1445 Facilitators: Jaylon Boylen, OT    Group Description: Group encouraged increased engagement and participation through discussion focused on Self-Care. Group members reviewed and identified specific categories of self-care including physical, emotional, social, spiritual, and professional self-care, identifying some of their current strengths. Discussion then transitioned into focusing on areas of improvement and brainstormed strategies and tips to improve in these areas of self-care. Discussion also identified impact of mental health on self-care practices.   Therapeutic Goal(s): Identify self-care areas of strength Identify self-care areas of improvement Identify and engage in activities to improve overall self-care  Participation Level: Group ran for ~10 minutes due to SW Group running late and patients going outside/getting snack. Patients were provided with group worksheet on self-care and directions explained by this writer. Pt encouraged to complete worksheet on their own and writer remained in group space for questions.  Modes of Intervention: Activity and Education  Plan: Continue to engage patient in OT groups 2 - 3x/week.  07/16/2021  Ludwika Rodd, OT 

## 2021-07-16 NOTE — Progress Notes (Signed)
Progress note  Pt found in bed. Pt didn't want to come up for medications. Pt eventually came later in the day and stated they had a vivid nightmare which made them not want to leave bed earlier. Pt is minimal with assessment. Pt stated they didn't like the nicotine gum so the patch was added back to their regimen. Pt can seem to be non-congruent at times but is pleasant. Pt denies si/hi/ah/vh and verbally agrees to approach staff if these become apparent or before harming themselves/others while at [facility].  A: Pt provided support and encouragement. Pt given medication per protocol and standing orders. Q51m safety checks implemented and continued.  R: Pt safe on the unit. Will continue to monitor.

## 2021-07-16 NOTE — Progress Notes (Signed)
Adult Psychoeducational Group Note  Date:  07/16/2021 Time:  8:34 PM  Group Topic/Focus:  Wrap-Up Group:   The focus of this group is to help patients review their daily goal of treatment and discuss progress on daily workbooks.  Participation Level:  Active  Participation Quality:  Appropriate  Affect:  Appropriate  Cognitive:  Appropriate  Insight: Appropriate  Engagement in Group:  Engaged  Modes of Intervention:  Discussion  Additional Comments:  patient said her day was 5. Goal for today try to have a good day even though had a nightmare. Patient said achieved goal for today coping skills breathing and avoid scratching.  Charna Busman Long 07/16/2021, 8:34 PM
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
## 2021-07-16 NOTE — Progress Notes (Signed)
   07/16/21 2108  °Psych Admission Type (Psych Patients Only)  °Admission Status Voluntary  °Psychosocial Assessment  °Patient Complaints Anxiety;Panic attack;Worrying  °Eye Contact Avertive;Brief  °Facial Expression Anxious;Pensive  °Affect Anxious;Appropriate to circumstance  °Speech Logical/coherent  °Interaction Assertive;Childlike  °Motor Activity Fidgety  °Appearance/Hygiene Unremarkable  °Behavior Characteristics Cooperative;Anxious  °Mood Anxious;Pleasant  °Thought Process  °Coherency Concrete thinking  °Content WDL  °Delusions None reported or observed  °Perception WDL  °Hallucination None reported or observed  °Judgment Poor  °Confusion None  °Danger to Self  °Current suicidal ideation? Denies  °Danger to Others  °Danger to Others None reported or observed  ° °

## 2021-07-16 NOTE — Progress Notes (Signed)
Patient's roommate informed RN that patient was having a panic attack. RN went to assess patient. Patient stated that "the molecules are mad at me" and that is why she was having a panic attack. RN went over relaxation techniques and focusing on breathing and grounding to reduce anxiety.

## 2021-07-16 NOTE — Progress Notes (Signed)
Adult Psychoeducational Group Note  Date:  07/16/2021 Time:  4:41 AM  Group Topic/Focus:  Wrap-Up Group:   The focus of this group is to help patients review their daily goal of treatment and discuss progress on daily workbooks.  Participation Level:  Active  Participation Quality:  Appropriate  Affect:  Appropriate  Cognitive:  Appropriate  Insight: Appropriate  Engagement in Group:  Engaged  Modes of Intervention:  Discussion  Additional Comments:  patient said her day was a 7. Her goal to prepare for discharge. Her coping skills deep breathing and positive self talk socializing. Her favorite color orange passionate fibrant.  Charna Busman Long 07/16/2021, 4:41 AM

## 2021-07-17 DIAGNOSIS — F603 Borderline personality disorder: Secondary | ICD-10-CM | POA: Diagnosis present

## 2021-07-17 MED ORDER — MELATONIN 3 MG PO TABS
3.0000 mg | ORAL_TABLET | Freq: Every day | ORAL | Status: DC
  Administered 2021-07-17 – 2021-07-20 (×4): 3 mg via ORAL
  Filled 2021-07-17 (×3): qty 1
  Filled 2021-07-17: qty 7
  Filled 2021-07-17 (×2): qty 1

## 2021-07-17 NOTE — Progress Notes (Incomplete)
°   07/16/21 2108  Psych Admission Type (Psych Patients Only)  Admission Status Voluntary  Psychosocial Assessment  Patient Complaints Anxiety;Panic attack;Worrying  Eye Contact Avertive;Brief  Facial Expression Anxious;Pensive  Affect Anxious;Appropriate to circumstance  Speech Logical/coherent  Interaction Assertive;Childlike  Motor Activity Fidgety  Appearance/Hygiene Unremarkable  Behavior Characteristics Cooperative;Anxious  Mood Anxious;Pleasant  Thought Process  Coherency Concrete thinking  Content WDL  Delusions None reported or observed  Perception WDL  Hallucination None reported or observed  Judgment Poor  Confusion None  Danger to Self  Current suicidal ideation? Denies  Danger to Others  Danger to Others None reported or observed

## 2021-07-17 NOTE — [facility] Group Notes (Signed)
Adult Psychoeducational Group Note  Date:  07/17/2021 Time:  9:14 PM  Group Topic/Focus:  Coping With Mental Health Crisis:   The purpose of this group is to help patients identify strategies for coping with mental health crisis.  Group discusses possible causes of crisis and ways to manage them effectively.  Participation Level:  Active  Participation Quality:  Attentive  Affect:  Appropriate  Cognitive:  Appropriate  Insight: Good  Engagement in Group:  Engaged  Modes of Intervention:  Discussion  Additional Comments  Jacalyn Lefevre 07/17/2021, 9:14 PM

## 2021-07-17 NOTE — [facility] Suicide Risk Assessment (Signed)
[facility] INPATIENT:  Family/Significant Other Suicide Prevention Education  Suicide Prevention Education:  Education Completed; [name] [phone] (Boyfriend) has been identified by the patient as the family member/significant other with whom the patient will be residing, and identified as the person(s) who will aid the patient in the event of a mental health crisis (suicidal ideations/suicide attempt).  With written consent from the patient, the family member/significant other has been provided the following suicide prevention education, prior to the and/or following the discharge of the patient.  The suicide prevention education provided includes the following: Suicide risk factors Suicide prevention and interventions National Suicide Hotline telephone number [facility] assessment telephone number [facility] Emergency Assistance 911 [facility] and/or Residential Mobile Crisis Unit telephone number  Request made of family/significant other to: Remove weapons (e.g., guns, rifles, knives), all items previously/currently identified as safety concern.   Remove drugs/medications (over-the-counter, prescriptions, illicit drugs), all items previously/currently identified as a safety concern.  The family member/significant other verbalizes understanding of the suicide prevention education information provided.  The family member/significant other agrees to remove the items of safety concern listed above.  CSW spoke with Mr. Izell Boothwyn who states that his girlfriend's doctor called in a wellness check for her and that was how she was referred tot he hospital.  He states that his girlfriend has been showing increased depression symptoms for several weeks and has been hearing voices.  He states that she has no mentioned suicidal thoughts.  He states that his girlfriend has no access to any firearms or weapons.  Mr. Izell Coalinga states that he will continue to be a support for his girlfriend  after she is discharge from the hospital.  CSW completed SPE with Mr. Izell Lodge Pole.    Megan Dunn 07/17/2021, 10:23 AM

## 2021-07-17 NOTE — Progress Notes (Signed)
   07/17/21 2107  Psych Admission Type (Psych Patients Only)  Admission Status Voluntary  Psychosocial Assessment  Patient Complaints Anxiety;Worrying  Eye Contact Avertive;Brief  Facial Expression Anxious;Sad;Trembling lip  Affect Anxious;Sad  Speech Logical/coherent  Interaction Assertive;Childlike  Motor Activity Fidgety  Appearance/Hygiene Unremarkable  Behavior Characteristics Cooperative;Anxious  Mood Anxious;Pleasant  Thought Process  Coherency Concrete thinking  Content WDL  Delusions None reported or observed  Perception WDL  Hallucination None reported or observed  Judgment Poor  Confusion None  Danger to Self  Current suicidal ideation? Denies  Danger to Others  Danger to Others None reported or observed

## 2021-07-17 NOTE — Group Note (Signed)
Recreation Therapy Group Note   Group Topic:Animal Assisted Therapy   Group Date: 07/17/2021 Start Time: 1430 End Time: 1515 Facilitators: Caroll Rancher, LRT,CTRS Location: [address]   AAA/T Program Assumption of Risk Form signed by Patient/ or Parent Legal Guardian Yes  Patient is free of allergies or severe asthma Yes  Patient reports no fear of animals Yes  Patient reports no history of cruelty to animals Yes  Patient understands his/her participation is voluntary Yes  Patient washes hands before animal contact Yes  Patient washes hands after animal contact Yes   Affect/Mood: Happy   Participation Level: Engaged   Participation Quality: Independent   Behavior: Interactive    Speech/Thought Process: Focused   Insight: Good   Judgement: Good   Modes of Intervention: Teaching laboratory technician   Patient Response to Interventions:  Engaged   Education Outcome:  Acknowledges education and In group clarification offered    Clinical Observations/Individualized Feedback: Patient attended session and interacted appropriately with therapy dog and peers. Patient asked appropriate questions about therapy dog and his training. Patient shared stories about their pets at home with group.     Plan: Continue to engage patient in RT group sessions 2-3x/week.   Caroll Rancher, LRT, CTRS 07/17/2021 3:50 PM

## 2021-07-17 NOTE — Progress Notes (Addendum)
[facility] MD Progress Note  07/17/2021 6:05 PM Megan Dunn  MRN:  [id number] Subjective:  Megan Dunn is a 20 year old with a past psychiatric history of schizoaffective disorder who presented as a voluntary walking to [facility] endorsing worsening mood as well as auditory and visual hallucinations.   Case was discussed in the multidisciplinary team. MAR was reviewed and patient was compliant with medications.  She did not require any PRN's for agitation. Staff noted that patient appears to seek 1 on 1 attention from staff a lot. Overnight patient's roommate alerted staff, that patient was having a "panic attack." Patient did not require medication intervention.   Psychiatric Team made the following recommendations yesterday:  - Continue Geodon 1m BID w/ meals titrate up as tolerated  On assessment this AM, patient reports that she is feeling very tired. Patient reports that she thinks the trazodone may be too much. Patient reports that she believes that she slept decently last night. Patient reports that she did feel like she was having a panic attack before bed last night. Patient reports "the molecules were mad and were pushing down on my chest." Patient reports that her roommate attempted to help her breath, then RN came and soothed patient. Patient reports she went into the hall with her roommate and met girls from the unit who did breathing exercises with the patient, and patient reports that ultimately she was able to return to her baseline. Patient reports that she is eating well and enjoying group. Patient reports that her VH is decreasing and she is only seeing JDi Kindleout of the corner of her eye, but endorses that her [facility] She denies command AH but reports AH telling her "they are mad because I am on new medication." Patient reports that she feels that since coming to the hospital they have been more quiet overall.  Patient denies SI, HI and urge to self harm.    Principal Problem: Schizoaffective disorder, bipolar type ([place] Diagnosis: Principal Problem:   Schizoaffective disorder, bipolar type ([place] Active Problems:   Borderline personality disorder ([place]  Total Time Spent in Direct Patient Care:  I personally spent 25 minutes on the unit in direct patient care. The direct patient care time included face-to-face time with the patient, reviewing the patient's chart, communicating with other professionals, and coordinating care. Greater than 50% of this time was spent in counseling or coordinating care with the patient regarding goals of hospitalization, psycho-education, and discharge planning needs.   Past Psychiatric History: See H&P  Past Medical History: History reviewed. No pertinent past medical history.  Past Surgical History:  Procedure Laterality Date   INCISION AND DRAINAGE ABSCESS N/A 01/23/2017   Procedure: DRAINAGE ABSCESS;  Surgeon: AStanford Scotland MD;  [place]  Service: General;  Laterality: N/A;   LAPAROSCOPIC APPENDECTOMY N/A 01/18/2017   Procedure: APPENDECTOMY LAPAROSCOPIC;  Surgeon: AStanford Scotland MD;  [place]  Service: General;  Laterality: N/A;   LAPAROTOMY N/A 01/23/2017   Procedure: EXPLORATORY LAPAROTOMY;  Surgeon: AStanford Scotland MD;  [place]  Service: General;  Laterality: N/A;   LYSIS OF ADHESION N/A 01/23/2017   Procedure: LYSIS OF ADHESION;  Surgeon: AStanford Scotland MD;  [place]  Service: General;  Laterality: N/A;   Family History:  Family History  Problem Relation Age of Onset   Aneurysm Mother        brain   Diabetes Maternal Grandmother  Stroke Maternal Grandfather    Family Psychiatric  History: See H&P Social History:  Social History   Substance and Sexual Activity  Alcohol Use No     Social History   Substance and Sexual Activity  Drug Use Not on file    Social History   Socioeconomic History   Marital status: Single    Spouse name: Not on file    Number of children: Not on file   Years of education: Not on file   Highest education level: Not on file  Occupational History   Not on file  Tobacco Use   Smoking status: Some Days    Packs/day: 0.25    Years: 1.00    Pack years: 0.25    Types: Cigarettes   Smokeless tobacco: Never  Vaping Use   Vaping Use: Every day  Substance and Sexual Activity   Alcohol use: No   Drug use: Not on file   Sexual activity: Not on file  Other Topics Concern   Not on file  Social History Narrative   Not on file   Social Determinants of Health   Financial Resource Strain: Not on file  Food Insecurity: Not on file  Transportation Needs: Not on file  Physical Activity: Not on file  Stress: Not on file  Social Connections: Not on file   Additional Social History:                         Sleep: Fair  Appetite:  Good  Current Medications: Current Facility-Administered Medications  Medication Dose Route Frequency Provider Last Rate Last Admin   acetaminophen (TYLENOL) tablet 650 mg  650 mg Oral Q6H PRN Bobbitt, Shalon E, NP   650 mg at 07/17/21 0815   hydrOXYzine (ATARAX) tablet 25 mg  25 mg Oral TID PRN Bobbitt, Shalon E, NP   25 mg at 07/17/21 0815   magnesium hydroxide (MILK OF MAGNESIA) suspension 30 mL  30 mL Oral Daily PRN Leevy-Johnson, Brooke A, NP       melatonin tablet 3 mg  3 mg Oral QHS McQuilla, Jai B, MD       nicotine polacrilex (NICORETTE) gum 2 mg  2 mg Oral PRN Freida Busman, MD       Norgestimate-Ethinyl Estradiol Triphasic 0.18/0.215/0.25 MG-25 MCG tablet 1 tablet  1 tablet Oral Daily Massengill, Nathan, MD   1 tablet at 07/16/21 2108   ziprasidone (GEODON) capsule 20 mg  20 mg Oral BID WC Damita Dunnings B, MD   20 mg at 07/17/21 4854    Lab Results:  Results for orders placed or performed during the hospital encounter of 07/14/21 (from the past 48 hour(s))  Hemoglobin A1c     Status: None   Collection Time: 07/15/21  6:40 PM  Result Value Ref Range    Hgb A1c MFr Bld 5.4 4.8 - 5.6 %    Comment: (NOTE) Pre diabetes:          5.7%-6.4%  Diabetes:              >6.4%  Glycemic control for   <7.0% adults with diabetes    Mean Plasma Glucose 108.28 mg/dL    Comment: Performed at [facility], [place] [address] [place], [place] [address]    Blood Alcohol level:  No results found for: [facility]  Metabolic Disorder Labs: Lab Results  Component Value Date   HGBA1C 5.4 07/15/2021   MPG 108.28 07/15/2021  No results found for: PROLACTIN Lab Results  Component Value Date   CHOL 214 (H) 07/15/2021   TRIG 160 (H) 07/15/2021   HDL 75 07/15/2021   CHOLHDL 2.9 07/15/2021   VLDL 32 07/15/2021   LDLCALC 107 (H) 07/15/2021    Physical Findings: AIMS: Facial and Oral Movements Muscles of Facial Expression: None, normal Lips and Perioral Area: None, normal Jaw: None, normal Tongue: None, normal,Extremity Movements Upper (arms, wrists, hands, fingers): None, normal Lower (legs, knees, ankles, toes): None, normal, Trunk Movements Neck, shoulders, hips: None, normal, Overall Severity Severity of abnormal movements (highest score from questions above): None, normal Incapacitation due to abnormal movements: None, normal Patient's awareness of abnormal movements (rate only patient's report): No Awareness, Dental Status Current problems with teeth and/or dentures?: No Does patient usually wear dentures?: No   Musculoskeletal: Strength & Muscle Tone: within normal limits Gait & Station: normal Patient leans: N/A  Psychiatric Specialty Exam:  Presentation  General Appearance: Appropriate for Environment (laying in bed, sits up when asked, open's eyes when asked)  Eye Contact:Poor  Speech:Clear and Coherent  Speech Volume:Decreased   Mood and Affect  Mood:aloof  Affect:constricted   Thought Process  Thought Processes:Goal Directed  Orientation:Full (Time, Place and Person)  Thought Content: reports TH of "molecules  pushing down on her" and AH "mad she is taking new medication"; denies first rank symptoms or ideas of reference; is not grossly responding to internal/external stimuli on exam  History of Schizophrenia/Schizoaffective disorder:yes Duration of Psychotic Symptoms:>6 months Hallucinations:Hallucinations: Auditory; Visual Description of Auditory Hallucinations: heard the voices tell her the molecules were mad at her. Description of Visual Hallucinations: seeing Mechele Claude out of the corner of her eye  Ideas of Reference:None  Suicidal Thoughts:Suicidal Thoughts: No  Homicidal Thoughts:Homicidal Thoughts: No   Sensorium  Memory:Immediate Good; Recent Good  Judgment:-- (Improving)  Insight:Shallow   Executive Functions  Concentration:Fair  Attention Span:Fair  [place]  Language:Good   Psychomotor Activity  Psychomotor Activity:Psychomotor Activity: Decreased   Assets  Assets:Communication Skills; Housing; Social Support   Sleep  Sleep:time unrecorded  Physical Exam HENT:     Head: Normocephalic and atraumatic.  Pulmonary:     Effort: Pulmonary effort is normal.  Skin:    General: Skin is dry.     Comments: Scabbed lesions covered by nursing staff  Neurological:     Mental Status: She is alert and oriented to person, place, and time.   Review of Systems  Respiratory:  Negative for shortness of breath.   Cardiovascular:  Negative for chest pain.  Gastrointestinal:  Negative for diarrhea, nausea and vomiting.  Psychiatric/Behavioral:  Positive for hallucinations. Negative for suicidal ideas. The patient is nervous/anxious.   Blood pressure 118/71, pulse 86, temperature 98.1 F (36.7 C), temperature source Oral, resp. rate 16, height _0  (1.626 m), weight 74.4 kg, SpO2 100 %. Body mass index is 28.15 kg/m.   Treatment Plan Summary: Daily contact with patient to assess and evaluate symptoms and progress in treatment and Medication  management  Megan Dunn is a 20 year old patient with a past psychiatric history of schizoaffective disorder, bipolar type and borderline personality disorder. Tonea continues to endorse AVH, and appears a bit reluctant to admit that it is getting better, but eventually does admit that she feels the Geodon is beneficial. Staff and provider have noted that the patient appears to seek validation and more than normal level of interaction for a 20 yo. Patient's Borderline characteristics are contributing heavily to patient's  assessment. Patient appears more drowsy and vague when interacting with staff, but is noted to interact differently (more alert and clear appearing) with patients.   Schizoaffective disorder, bipolar type by hx Borderline personality disorder by hx - Continue Geodon 47m BID w/ meals - titrate up as tolerated  - A1c 5.4, Cholestesrol 214, triglycerides 160, HDL 75, LDL 107; EKG pending for QTC monitoring - Melatonin 347mfor insomnia and stop Trazodone due to daytime sedation - Encourage patient to interact with others in the milieu as well as attend group therapy sessions - SW consult for dispo planning including therapy and psychiatric outpatient care, patient endorsed that she is no longer able to see her outpatient therapist due to insurance  Hyperlipidemia - will need to f/u with PCP after discharge  Leukocytosis and Thrombocytopenia - Rechecking CBC for trending  Hyponatremia - Rechecking BMP for trending   PRN -Tylenol 6509m6h, pain -Maalox 66m34mh, indigestion -Atarax 25mg92m, anxiety -Milk of Mag 66mL,39mstipation -Discontinue Trazodone 50mg Q49minsomnia    PGY-2 Sekai Nayak E SHarlow Asa/01/2021, 6:05 PM  I have independently evaluated the patient during a face-to-face assessment on 07/17/21. I reviewed the patient's chart, and I participated in key portions of the service. I discussed the case with the House ORoss Stores agree with the assessment and  plan of care as documented in the House Officer's note.   I personally spent 30 minutes on the unit in direct patient care. The direct patient care time included face-to-face time with the patient, reviewing the patient's chart, communicating with other professionals, and coordinating care. Greater than 50% of this time was spent in counseling or coordinating care with the patient regarding goals of hospitalization, psycho-education, and discharge planning needs.  On my assessment, patient reports TH, AH, and VH that are improving. She reports oversedation on Trazodone. I agree with plans to titrate up as tolerated over coming days on Geodon and discontinue Trazodone. Repeating BMP and CBC for monitoring of platelets, WBC, and Na+.  Devinne Epstein SinViann FishAPAAlda Ponder

## 2021-07-17 NOTE — Group Note (Signed)
Date:  07/17/2021 Time:  9:11 AM  Group Topic/Focus:  Orientation:   The focus of this group is to educate the patient on the purpose and policies of crisis stabilization and provide a format to answer questions about their admission.  The group details unit policies and expectations of patients while admitted.    Participation Level:  Active  Participation Quality:  Appropriate  Affect:  Appropriate  Cognitive:  Appropriate  Insight: Appropriate  Engagement in Group:  Engaged  Modes of Intervention:  Discussion  Additional Comments:  Talking to Dr and being more honest  Jaquita Rector 07/17/2021, 9:11 AM

## 2021-07-18 LAB — BASIC METABOLIC PANEL
Anion gap: 9 (ref 5–15)
BUN: 11 mg/dL (ref 6–20)
CO2: 23 mmol/L (ref 22–32)
Calcium: 9.2 mg/dL (ref 8.9–10.3)
Chloride: 105 mmol/L (ref 98–111)
Creatinine, Ser: 0.61 mg/dL (ref 0.44–1.00)
GFR, Estimated: >60 mL/min (ref >60)
Glucose, Bld: 90 mg/dL (ref 70–99)
Potassium: 3.6 mmol/L (ref 3.5–5.1)
Sodium: 137 mmol/L (ref 135–145)

## 2021-07-18 LAB — CBC WITH DIFFERENTIAL/PLATELET
Abs Immature Granulocytes: 0.04 10*3/uL (ref 0.00–0.07)
Basophils Absolute: 0 10*3/uL (ref 0.0–0.1)
Basophils Relative: 0 %
Eosinophils Absolute: 0.2 10*3/uL (ref 0.0–0.5)
Eosinophils Relative: 2 %
HCT: 39.2 % (ref 36.0–46.0)
Hemoglobin: 12.3 g/dL (ref 12.0–15.0)
Immature Granulocytes: 0 %
Lymphocytes Relative: 36 %
Lymphs Abs: 3.8 10*3/uL (ref 0.7–4.0)
MCH: 26.9 pg (ref 26.0–34.0)
MCHC: 31.4 g/dL (ref 30.0–36.0)
MCV: 85.8 fL (ref 80.0–100.0)
Monocytes Absolute: 0.6 10*3/uL (ref 0.1–1.0)
Monocytes Relative: 6 %
Neutro Abs: 5.8 10*3/uL (ref 1.7–7.7)
Neutrophils Relative %: 56 %
Platelets: 363 10*3/uL (ref 150–400)
RBC: 4.57 MIL/uL (ref 3.87–5.11)
RDW: 14.1 % (ref 11.5–15.5)
WBC: 10.5 10*3/uL (ref 4.0–10.5)
nRBC: 0 % (ref 0.0–0.2)

## 2021-07-18 MED ORDER — ZIPRASIDONE HCL 40 MG PO CAPS
40.0000 mg | ORAL_CAPSULE | Freq: Two times a day (BID) | ORAL | Status: DC
  Administered 2021-07-18 – 2021-07-21 (×6): 40 mg via ORAL
  Filled 2021-07-18 (×2): qty 1
  Filled 2021-07-18: qty 7
  Filled 2021-07-18 (×6): qty 1
  Filled 2021-07-18: qty 7
  Filled 2021-07-18 (×2): qty 1
  Filled 2021-07-18: qty 14

## 2021-07-18 MED ORDER — NICOTINE 21 MG/24HR TD PT24
MEDICATED_PATCH | TRANSDERMAL | Status: AC
  Filled 2021-07-18: qty 1

## 2021-07-18 MED ORDER — NICOTINE 21 MG/24HR TD PT24
21.0000 mg | MEDICATED_PATCH | Freq: Every day | TRANSDERMAL | Status: DC
  Administered 2021-07-18 – 2021-07-21 (×4): 21 mg via TRANSDERMAL
  Filled 2021-07-18 (×4): qty 1

## 2021-07-18 NOTE — Progress Notes (Signed)
The patient attended the evening group and was appropriate.  °

## 2021-07-18 NOTE — Group Note (Signed)
Recreation Therapy Group Note   Group Topic:Stress Management  Group Date: 07/18/2021 Start Time: 0930 End Time: 0950 Facilitators: Caroll Rancher, LRT,CTRS Location: [address] Dayroom   Goal Area(s) Addresses:  Patient will actively participate in stress management techniques presented during session.  Patient will successfully identify benefit of practicing stress management post d/c.   Group Description: Guided Imagery. LRT provided education, instruction, and demonstration on practice of visualization via guided imagery. Patient was asked to participate in the technique introduced during session. LRT debriefed including topics of mindfulness, stress management and specific scenarios each patient could use these techniques. Patients were given suggestions of ways to access scripts post d/c and encouraged to explore Youtube and other apps available on smartphones, tablets, and computers.   Affect/Mood: Appropriate   Participation Level: Active   Participation Quality: Independent   Behavior: Attentive    Speech/Thought Process: Focused   Insight: Good   Judgement: Good   Modes of Intervention: Script, Nature Sounds   Patient Response to Interventions:  Engaged   Education Outcome:  Acknowledges education and In group clarification offered    Clinical Observations/Individualized Feedback: Pt attended and participated in group.    Plan: Continue to engage patient in RT group sessions 2-3x/week.   Caroll Rancher, LRT,CTRS 07/18/2021 1:04 PM

## 2021-07-18 NOTE — Group Note (Addendum)
LCSW Group Therapy Note  Group Date: 07/18/2021 Start Time: 1300 End Time: 1400   Type of Therapy and Topic:  Group Therapy - Healthy vs Unhealthy Coping Skills  Participation Level:  Active   Description of Group The focus of this group was to determine what unhealthy coping techniques typically are used by group members and what healthy coping techniques would be helpful in coping with various problems. Patients were guided in becoming aware of the differences between healthy and unhealthy coping techniques. Patients were asked to identify 2-3 healthy coping skills they would like to learn to use more effectively.  Therapeutic Goals Patients learned that coping is what human beings do all day long to deal with various situations in their lives Patients defined and discussed healthy vs unhealthy coping techniques Patients identified their preferred coping techniques and identified whether these were healthy or unhealthy Patients determined 2-3 healthy coping skills they would like to become more familiar with and use more often. Patients provided support and ideas to each other   Summary of Patient Progress:  During group the Pt expressed that using the counting technique was a helpful coping skill for them. Patient proved open to input from peers and feedback from CSW. Patient demonstrated insight into the subject matter, was respectful of peers, and participated throughout the entire session.   Therapeutic Modalities Cognitive Behavioral Therapy Motivational Interviewing  Aram Beecham, [place] 07/18/2021  2:41 PM

## 2021-07-18 NOTE — Progress Notes (Addendum)
[facility] MD Progress Note  07/18/2021 6:46 PM Megan Dunn  MRN:  [id number] Subjective:  Megan Dunn is a 20 year old with a past psychiatric history of schizoaffective disorder who presented as a voluntary walking to [facility] endorsing worsening mood as well as auditory and visual hallucinations.   Case was discussed in the multidisciplinary team. MAR was reviewed and patient was compliant with medications.  She did not require any PRN's for agitation.   Psychiatric Team made the following recommendations yesterday:  - Continue Geodon 20mg  BID w/ meals titrate up as tolerated - - Melatonin 3mg  for insomnia and stop Trazodone due to daytime sedation  On assessment this AM patient was found sitting in the cubby corner crying. Patient reports that she was upset by recent interactions with another patient. Patient reports that she has felt that this other patient has said negative things about people on the unit and has also making fun of her crying in response to things and the relationships she has built on the unit. Patient endorses that she will try not to allow this to dictate how the rest of her day goes. Patient reports that she slept well last night and is eating better. Patient denies SI, HI, and AVH. She denies ideas of reference or first rank symptoms. Patient also denies thoughts of self- harm. She denies medication side-effects.  Principal Problem: Schizoaffective disorder, bipolar type (HCC) Diagnosis: Principal Problem:   Schizoaffective disorder, bipolar type (HCC) Active Problems:   Borderline personality disorder (HCC)  Total Time Spent in Direct Patient Care:  I personally spent 25 minutes on the unit in direct patient care. The direct patient care time included face-to-face time with the patient, reviewing the patient's chart, communicating with other professionals, and coordinating care. Greater than 50% of this time was spent in counseling or coordinating care with the patient  regarding goals of hospitalization, psycho-education, and discharge planning needs.   Past Psychiatric History: See H&P  Past Medical History: History reviewed. No pertinent past medical history.  Past Surgical History:  Procedure Laterality Date   INCISION AND DRAINAGE ABSCESS N/A 01/23/2017   Procedure: DRAINAGE ABSCESS;  Surgeon: , MD;  Location: [facility] OR;  Service: General;  Laterality: N/A;   LAPAROSCOPIC APPENDECTOMY N/A 01/18/2017   Procedure: APPENDECTOMY LAPAROSCOPIC;  Surgeon: Kandice Hams, MD;  Location: [facility] OR;  Service: General;  Laterality: N/A;   LAPAROTOMY N/A 01/23/2017   Procedure: EXPLORATORY LAPAROTOMY;  Surgeon: Kandice Hams, MD;  Location: [facility] OR;  Service: General;  Laterality: N/A;   LYSIS OF ADHESION N/A 01/23/2017   Procedure: LYSIS OF ADHESION;  Surgeon: Kandice Hams, MD;  Location: [facility] OR;  Service: General;  Laterality: N/A;   Family History:  Family History  Problem Relation Age of Onset   Aneurysm Mother        brain   Diabetes Maternal Grandmother    Stroke Maternal Grandfather    Family Psychiatric  History: See H&P Social History:  Social History   Substance and Sexual Activity  Alcohol Use No     Social History   Substance and Sexual Activity  Drug Use Not on file    Social History   Socioeconomic History   Marital status: Single    Spouse name: Not on file   Number of children: Not on file   Years of education: Not on file   Highest education level: Not on file  Occupational History   Not on file  Tobacco Use  Smoking status: Some Days    Packs/day: 0.25    Years: 1.00    Pack years: 0.25    Types: Cigarettes   Smokeless tobacco: Never  Vaping Use   Vaping Use: Every day  Substance and Sexual Activity   Alcohol use: No   Drug use: Not on file   Sexual activity: Not on file  Other Topics Concern   Not on file  Social History Narrative   Not on file   Social Determinants of Health   Financial Resource  Strain: Not on file  Food Insecurity: Not on file  Transportation Needs: Not on file  Physical Activity: Not on file  Stress: Not on file  Social Connections: Not on file    Sleep: Good  Appetite:  Good  Current Medications: Current Facility-Administered Medications  Medication Dose Route Frequency Provider Last Rate Last Admin   acetaminophen (TYLENOL) tablet 650 mg  650 mg Oral Q6H PRN Bobbitt, Shalon E, NP   650 mg at 07/18/21 0755   hydrOXYzine (ATARAX) tablet 25 mg  25 mg Oral TID PRN Bobbitt, Shalon E, NP   25 mg at 07/18/21 1517   magnesium hydroxide (MILK OF MAGNESIA) suspension 30 mL  30 mL Oral Daily PRN Leevy-Johnson, Brooke A, NP       melatonin tablet 3 mg  3 mg Oral QHS Eliseo Gum B, MD   3 mg at 07/17/21 2107   nicotine (NICODERM CQ - dosed in mg/24 hours) 21 mg/24hr patch            nicotine (NICODERM CQ - dosed in mg/24 hours) patch 21 mg  21 mg Transdermal Daily Mason Jim, Gearldine Looney E, MD   21 mg at 07/18/21 1517   Norgestimate-Ethinyl Estradiol Triphasic 0.18/0.215/0.25 MG-25 MCG tablet 1 tablet  1 tablet Oral Daily Massengill, Harrold Donath, MD   1 tablet at 07/17/21 2107   ziprasidone (GEODON) capsule 40 mg  40 mg Oral BID WC Eliseo Gum B, MD   40 mg at 07/18/21 1617    Lab Results:  Results for orders placed or performed during the hospital encounter of 07/14/21 (from the past 48 hour(s))  CBC with Differential/Platelet     Status: None   Collection Time: 07/18/21  6:19 AM  Result Value Ref Range   WBC 10.5 4.0 - 10.5 K/uL   RBC 4.57 3.87 - 5.11 MIL/uL   Hemoglobin 12.3 12.0 - 15.0 g/dL   HCT 16.1 09.6 - 04.5 %   MCV 85.8 80.0 - 100.0 fL   MCH 26.9 26.0 - 34.0 pg   MCHC 31.4 30.0 - 36.0 g/dL   RDW 40.9 81.1 - 91.4 %   Platelets 363 150 - 400 K/uL   nRBC 0.0 0.0 - 0.2 %   Neutrophils Relative % 56 %   Neutro Abs 5.8 1.7 - 7.7 K/uL   Lymphocytes Relative 36 %   Lymphs Abs 3.8 0.7 - 4.0 K/uL   Monocytes Relative 6 %   Monocytes Absolute 0.6 0.1 - 1.0 K/uL    Eosinophils Relative 2 %   Eosinophils Absolute 0.2 0.0 - 0.5 K/uL   Basophils Relative 0 %   Basophils Absolute 0.0 0.0 - 0.1 K/uL   Immature Granulocytes 0 %   Abs Immature Granulocytes 0.04 0.00 - 0.07 K/uL    Comment: Performed at [facility], [address] [place], [place] [address]  Basic metabolic panel     Status: None   Collection Time: 07/18/21  6:19 AM  Result Value Ref Range   Sodium 137 135 - 145 mmol/L   Potassium 3.6 3.5 - 5.1 mmol/L   Chloride 105 98 - 111 mmol/L   CO2 23 22 - 32 mmol/L   Glucose, Bld 90 70 - 99 mg/dL    Comment: Glucose reference range applies only to samples taken after fasting for at least 8 hours.   BUN 11 6 - 20 mg/dL   Creatinine, Ser 8.18 0.44 - 1.00 mg/dL   Calcium 9.2 8.9 - 29.9 mg/dL   GFR, Estimated >37 >16 mL/min    Comment: (NOTE) Calculated using the CKD-EPI Creatinine Equation (2021)    Anion gap 9 5 - 15    Comment: Performed at [facility], [address] [place], [place] [address]    Blood Alcohol level:  No results found for: [facility]  Metabolic Disorder Labs: Lab Results  Component Value Date   HGBA1C 5.4 07/15/2021   MPG 108.28 07/15/2021   No results found for: PROLACTIN Lab Results  Component Value Date   CHOL 214 (H) 07/15/2021   TRIG 160 (H) 07/15/2021   HDL 75 07/15/2021   CHOLHDL 2.9 07/15/2021   VLDL 32 07/15/2021   LDLCALC 107 (H) 07/15/2021    Physical Findings: AIMS: Facial and Oral Movements Muscles of Facial Expression: None, normal Lips and Perioral Area: None, normal Jaw: None, normal Tongue: None, normal,Extremity Movements Upper (arms, wrists, hands, fingers): None, normal Lower (legs, knees, ankles, toes): None, normal, Trunk Movements Neck, shoulders, hips: None, normal, Overall Severity Severity of abnormal movements (highest score from questions above): None, normal Incapacitation due to abnormal movements: None, normal Patient's awareness of  abnormal movements (rate only patient's report): No Awareness, Dental Status Current problems with teeth and/or dentures?: No Does patient usually wear dentures?: No    Musculoskeletal: Strength & Muscle Tone: within normal limits Gait & Station: normal Patient leans: N/A  Psychiatric Specialty Exam:  Presentation  General Appearance: Casually dressed, adequate hygiene, tearful on approach  Eye Contact:Poor secondary to crying  Speech:Clear and Coherent  Speech Volume:Decreased  Mood and Affect  Mood:Dysphoric  Affect:Tearful, labile   Thought Process  Thought Processes:Goal Directed  Descriptions of Associations:Circumstantial  Orientation:Full (Time, Place and Person)  Thought Content:Denies AVH, paranoia, delusions, ideas of reference or first rank symptoms - is not grossly responding to internal/external stimuli on exam  Hallucinations: Denied today  Ideas of Reference:None  Suicidal Thoughts:Suicidal Thoughts: No  Homicidal Thoughts:Homicidal Thoughts: No   Sensorium  Memory: Recent - good  Judgment:-- (improving)  Insight:Shallow   Executive Functions  Concentration:Fair  Attention Span:Fair  Recall:Fair Fund of Knowledge:Good  Language:Good   Psychomotor Activity  Psychomotor Activity:Psychomotor Activity: Normal   Assets  Assets:Communication Skills; Housing; Research scientist (medical); Resilience   Sleep  Time unrecorded   Physical Exam HENT:     Head: Normocephalic and atraumatic.  Pulmonary:     Effort: Pulmonary effort is normal.  Skin:    General: Skin is dry.  Neurological:     Mental Status: She is alert and oriented to person, place, and time.   Review of Systems  Respiratory:  Negative for shortness of breath.   Cardiovascular:  Negative for chest pain.  Gastrointestinal:  Negative for diarrhea, nausea and vomiting.  Psychiatric/Behavioral:  Negative for hallucinations and suicidal ideas.   Blood pressure 108/69, pulse  95, temperature 98 F (36.7 C), temperature source Oral, resp. rate 16, height 5\' 4"  (1.626 m), weight 74.4 kg, SpO2 99 %. Body mass  index is 28.15 kg/m.   Treatment Plan Summary: Daily contact with patient to assess and evaluate symptoms and progress in treatment and Medication management Prestina Raigoza is a 20 year old patient with a past psychiatric history of schizoaffective disorder, bipolar type and borderline personality disorder. Today was the first day patient did not endorse AVH. Patient continues to be bit unstable regarding her mood, but appears to be improving. Patient was also noted to be less drowsy this AM.  Schizoaffective disorder, bipolar type by hx Borderline personality disorder by hx - Increase Geodon to 40mg  BID w/ meals - titrate up as tolerated             - A1c 5.4, Cholestesrol 214, triglycerides 160, HDL 75, LDL 107;  QTC - Melatonin 3mg  for insomnia and stopped Trazodone due to daytime sedation - Encourage patient to interact with others in the milieu as well as attend group therapy sessions - SW consult for dispo planning including therapy and psychiatric outpatient care, patient endorsed that she is no longer able to see her outpatient therapist due to insurance   Hyperlipidemia - will need to f/u with PCP after discharge   Leukocytosis and Thrombocytopenia- resolved  Hyponatremia- resolved    PRN -Tylenol 650mg  q6h, pain -Maalox 27ml q4h, indigestion -Atarax 25mg  TID, anxiety -Milk of Mag 63mL, constipation -Discontinue Trazodone 50mg  QHS, insomnia        PGY-2 , MD 07/18/2021, 6:46 PM

## 2021-07-18 NOTE — Progress Notes (Signed)
Pt denies SI/HI/AVH and verbally agrees to approach staff if these become apparent or before harming themselves/others. Rates depression 0/10. Rates anxiety 3/10. Rates pain 5/10 in back and arm. Pt was groggy this morning. Pt had increasing anxiety and pt raised voice to RN when asking for a nicotine patch. Pt started to cry and stated that "my emotions are everywhere." Pt stated that she was not sure why. Pt has been out in the milieu. Scheduled medications administered to Pt, per MD orders. RN provided support and encouragement to Pt. Q15 min safety checks implemented and continued. Pt safe on the unit. RN will continue to monitor and intervene as needed.   07/18/21 0755  Psych Admission Type (Psych Patients Only)  Admission Status Voluntary  Psychosocial Assessment  Patient Complaints Anxiety;Other (Comment) (pain)  Eye Contact Brief;Avoids  Facial Expression Anxious;Sad  Affect Anxious;Appropriate to circumstance;Sad;Labile  Speech Logical/coherent  Interaction Assertive;Childlike  Motor Activity Slow  Appearance/Hygiene Unremarkable  Behavior Characteristics Cooperative;Anxious;Agitated  Mood Anxious;Sad;Labile  Thought Process  Coherency Concrete thinking  Content WDL  Delusions None reported or observed  Perception Hallucinations  Hallucination Auditory (when anxious hears voices)  Judgment Poor  Confusion None  Danger to Self  Current suicidal ideation? Denies  Danger to Others  Danger to Others None reported or observed

## 2021-07-19 NOTE — [facility] Group Notes (Signed)
Patient did not attend the relaxation group. 

## 2021-07-19 NOTE — Progress Notes (Signed)
Pt denies SI/HI/AVH and verbally agrees to approach staff if these become apparent or before harming themselves/others. Rates depression 0/10. Rates anxiety 4/10. Rates pain 5/10 in back. Pt is slightly better. Pt is not as irritable today. Pt seen in dayroom and interacting with others but reserved. Scheduled medications administered to Pt, per MD orders. RN provided support and encouragement to Pt. Q15 min safety checks implemented and continued. Pt safe on the unit. RN will continue to monitor and intervene as needed.   07/19/21 0826  Psych Admission Type (Psych Patients Only)  Admission Status Voluntary  Psychosocial Assessment  Patient Complaints Anxiety;Other (Comment) (pain)  Eye Contact Avertive;Brief  Facial Expression Flat;Sad  Affect Sad;Blunted  Speech Logical/coherent;Soft;Slow  Interaction Forwards little;Childlike  Motor Activity Slow  Appearance/Hygiene Unremarkable  Behavior Characteristics Cooperative;Anxious  Mood Anxious;Pleasant  Thought Process  Coherency Concrete thinking  Content WDL  Delusions None reported or observed  Perception WDL  Hallucination None reported or observed  Judgment Poor  Confusion None  Danger to Self  Current suicidal ideation? Denies  Danger to Others  Danger to Others None reported or observed

## 2021-07-19 NOTE — Progress Notes (Addendum)
[facility] MD Progress Note  07/19/2021 1:53 PM Megan Dunn  MRN:  [id number] Subjective:  Megan Dunn is a 20 year old with a past psychiatric history of schizoaffective disorder who presented as a voluntary walking to [facility] endorsing worsening mood as well as auditory and visual hallucinations.   Case was discussed in the multidisciplinary team. MAR was reviewed and patient was compliant with medications.  She did not require any PRN's for agitation.  Psychiatric Team made the following recommendations yesterday:   Increase Geodon to  BID w/ meals - titrate up as tolerated  On assessment this AM patient reports that she is doing well. Patient reports that she is less paranoid since she was admitted. Patient reports that she is no longer feeling that people will "turn" against her and does not make paranoid or persecutory statements today. Patient reports her appetite is "fine" and she does not believe she is having any adverse side effects with her medication.She reports stable sleep. Patient denies SI, HI and AVH and specifically denies believing that "the molecules" are mad at her and denies seeing "Megan Dunn" recently. She denies tactile hallucinations. She denies ideas of reference or first rank symptoms. Patient reports that she feels supported by her friends and bf and spent time talking about a plan for discharge.   Principal Problem: Schizoaffective disorder, bipolar type (HCC) Diagnosis: Principal Problem:   Schizoaffective disorder, bipolar type (HCC) Active Problems:   Borderline personality disorder (HCC)  Total Time Spent in Direct Patient Care:  I personally spent 25 minutes on the unit in direct patient care. The direct patient care time included face-to-face time with the patient, reviewing the patient's chart, communicating with other professionals, and coordinating care. Greater than 50% of this time was spent in counseling or coordinating care with the patient regarding goals of  hospitalization, psycho-education, and discharge planning needs.  Past Psychiatric History: See H&P  Past Medical History: History reviewed. No pertinent past medical history.  Past Surgical History:  Procedure Laterality Date   INCISION AND DRAINAGE ABSCESS N/A 01/23/2017   Procedure: DRAINAGE ABSCESS;  Surgeon: Kandice Hams, MD;  Location: [facility] OR;  Service: General;  Laterality: N/A;   LAPAROSCOPIC APPENDECTOMY N/A 01/18/2017   Procedure: APPENDECTOMY LAPAROSCOPIC;  Surgeon: Kandice Hams, MD;  Location: [facility] OR;  Service: General;  Laterality: N/A;   LAPAROTOMY N/A 01/23/2017   Procedure: EXPLORATORY LAPAROTOMY;  Surgeon: Kandice Hams, MD;  Location: [facility] OR;  Service: General;  Laterality: N/A;   LYSIS OF ADHESION N/A 01/23/2017   Procedure: LYSIS OF ADHESION;  Surgeon: Kandice Hams, MD;  Location: [facility] OR;  Service: General;  Laterality: N/A;   Family History:  Family History  Problem Relation Age of Onset   Aneurysm Mother        brain   Diabetes Maternal Grandmother    Stroke Maternal Grandfather    Family Psychiatric  History: See H&P Social History:  Social History   Substance and Sexual Activity  Alcohol Use No     Social History   Substance and Sexual Activity  Drug Use Not on file    Social History   Socioeconomic History   Marital status: Single    Spouse name: Not on file   Number of children: Not on file   Years of education: Not on file   Highest education level: Not on file  Occupational History   Not on file  Tobacco Use   Smoking status: Some Days    Packs/day: 0.25  Years: 1.00    Pack years: 0.25    Types: Cigarettes   Smokeless tobacco: Never  Vaping Use   Vaping Use: Every day  Substance and Sexual Activity   Alcohol use: No   Drug use: Not on file   Sexual activity: Not on file  Other Topics Concern   Not on file  Social History Narrative   Not on file   Social Determinants of Health   Financial Resource Strain: Not on file   Food Insecurity: Not on file  Transportation Needs: Not on file  Physical Activity: Not on file  Stress: Not on file  Social Connections: Not on file   Sleep: Good  Appetite:  Good  Current Medications: Current Facility-Administered Medications  Medication Dose Route Frequency Provider Last Rate Last Admin   acetaminophen (TYLENOL) tablet 650 mg  650 mg Oral Q6H PRN Bobbitt, Shalon E, NP   650 mg at 07/18/21 0755   hydrOXYzine (ATARAX) tablet 25 mg  25 mg Oral TID PRN Bobbitt, Shalon E, NP   25 mg at 07/18/21 1517   magnesium hydroxide (MILK OF MAGNESIA) suspension 30 mL  30 mL Oral Daily PRN Leevy-Johnson, Brooke A, NP       melatonin tablet 3 mg  3 mg Oral QHS Eliseo Gum B, MD   3 mg at 07/18/21 2150   nicotine (NICODERM CQ - dosed in mg/24 hours) patch 21 mg  21 mg Transdermal Daily Mason Jim, Yolando Gillum E, MD   21 mg at 07/19/21 5284   Norgestimate-Ethinyl Estradiol Triphasic 0.18/0.215/0.25 MG-25 MCG tablet 1 tablet  1 tablet Oral Daily Massengill, Harrold Donath, MD   1 tablet at 07/18/21 2210   ziprasidone (GEODON) capsule 40 mg  40 mg Oral BID WC Eliseo Gum B, MD   40 mg at 07/19/21 1324    Lab Results:  Results for orders placed or performed during the hospital encounter of 07/14/21 (from the past 48 hour(s))  CBC with Differential/Platelet     Status: None   Collection Time: 07/18/21  6:19 AM  Result Value Ref Range   WBC 10.5 4.0 - 10.5 K/uL   RBC 4.57 3.87 - 5.11 MIL/uL   Hemoglobin 12.3 12.0 - 15.0 g/dL   HCT 40.1 02.7 - 25.3 %   MCV 85.8 80.0 - 100.0 fL   MCH 26.9 26.0 - 34.0 pg   MCHC 31.4 30.0 - 36.0 g/dL   RDW 66.4 40.3 - 47.4 %   Platelets 363 150 - 400 K/uL   nRBC 0.0 0.0 - 0.2 %   Neutrophils Relative % 56 %   Neutro Abs 5.8 1.7 - 7.7 K/uL   Lymphocytes Relative 36 %   Lymphs Abs 3.8 0.7 - 4.0 K/uL   Monocytes Relative 6 %   Monocytes Absolute 0.6 0.1 - 1.0 K/uL   Eosinophils Relative 2 %   Eosinophils Absolute 0.2 0.0 - 0.5 K/uL   Basophils Relative 0 %    Basophils Absolute 0.0 0.0 - 0.1 K/uL   Immature Granulocytes 0 %   Abs Immature Granulocytes 0.04 0.00 - 0.07 K/uL    Comment: Performed at [facility], [address], [place], [place] [address]  Basic metabolic panel     Status: None   Collection Time: 07/18/21  6:19 AM  Result Value Ref Range   Sodium 137 135 - 145 mmol/L   Potassium 3.6 3.5 - 5.1 mmol/L   Chloride 105 98 - 111 mmol/L   CO2 23 22 - 32  mmol/L   Glucose, Bld 90 70 - 99 mg/dL    Comment: Glucose reference range applies only to samples taken after fasting for at least 8 hours.   BUN 11 6 - 20 mg/dL   Creatinine, Ser 9.83 0.44 - 1.00 mg/dL   Calcium 9.2 8.9 - 38.2 mg/dL   GFR, Estimated >50 >53 mL/min    Comment: (NOTE) Calculated using the CKD-EPI Creatinine Equation (2021)    Anion gap 9 5 - 15    Comment: Performed at [facility], [address] [place], [place] [address]    Blood Alcohol level:  No results found for: [facility]  Metabolic Disorder Labs: Lab Results  Component Value Date   HGBA1C 5.4 07/15/2021   MPG 108.28 07/15/2021   No results found for: PROLACTIN Lab Results  Component Value Date   CHOL 214 (H) 07/15/2021   TRIG 160 (H) 07/15/2021   HDL 75 07/15/2021   CHOLHDL 2.9 07/15/2021   VLDL 32 07/15/2021   LDLCALC 107 (H) 07/15/2021    Physical Findings: AIMS: Facial and Oral Movements Muscles of Facial Expression: None, normal Lips and Perioral Area: None, normal Jaw: None, normal Tongue: None, normal,Extremity Movements Upper (arms, wrists, hands, fingers): None, normal Lower (legs, knees, ankles, toes): None, normal, Trunk Movements Neck, shoulders, hips: None, normal, Overall Severity Severity of abnormal movements (highest score from questions above): None, normal Incapacitation due to abnormal movements: None, normal Patient's awareness of abnormal movements (rate only patient's report): No Awareness, Dental Status Current problems with  teeth and/or dentures?: No Does patient usually wear dentures?: No    Musculoskeletal: Strength & Muscle Tone: within normal limits Gait & Station: normal Patient leans: N/A  Psychiatric Specialty Exam:  Presentation  General Appearance: Appropriate for Environment, pink hair, casually dressed with adequate hygiene  Eye Contact:Good  Speech:Clear and Coherent  Speech Volume:Normal  Mood and Affect  Mood:described as improved - appears calm and more euthymic  Affect:brighter appearing overall but still mildly constricted at times   Thought Process  Thought Processes:Goal Directed, linear  Descriptions of Associations:Intact  Orientation:Full (Time, Place and Person)  Thought Content:Logical - denies paranoia, AVH, ideas of reference, first rank symptoms, or delusions  Hallucinations:Hallucinations: None  Ideas of Reference:None  Suicidal Thoughts:Suicidal Thoughts: No  Homicidal Thoughts:Homicidal Thoughts: No   Sensorium  Memory:Immediate Good; Recent Good  Judgment:-- (Improving)  Insight:Shallow   Executive Functions  Concentration:Fair  Attention Span:Fair  Recall:Fair  Fund of Knowledge:Good  Language:Good   Psychomotor Activity  Psychomotor Activity:No cogwheeling, no stiffness, no tremor; AIMS 0   Assets  Assets:Communication Skills; Housing; Social Support; Resilience   Sleep time unrecorded   Physical Exam Constitutional:      Appearance: Normal appearance.  HENT:     Head: Normocephalic and atraumatic.  Pulmonary:     Effort: Pulmonary effort is normal.  Neurological:     Mental Status: She is alert and oriented to person, place, and time.   Review of Systems  Respiratory:  Negative for shortness of breath.   Cardiovascular:  Negative for chest pain.  Gastrointestinal:  Negative for diarrhea, nausea and vomiting.  Psychiatric/Behavioral:  Negative for suicidal ideas.   Blood pressure 108/69, pulse 95, temperature 98 F  (36.7 C), temperature source Oral, resp. rate 16, height 5\' 4"  (1.626 m), weight 74.4 kg, SpO2 99 %. Body mass index is 28.15 kg/m.   Treatment Plan Summary: Daily contact with patient to assess and evaluate symptoms and progress in treatment and Medication  management  Megan Dunn is a 20 year old patient with a past psychiatric history of schizoaffective disorder, bipolar type and borderline personality disorder. Patient continues to improve. Patient appears to be more interactive an involved on assessment in the later AM, and is not oversedated appearing. Patient had enough insight to endorse today, that she believes that seeing her later would be better for her care. Patient appears to be doing well in group and is engaged in her care.   Schizoaffective disorder, bipolar type by hx Borderline personality disorder by hx - Continue  Geodon to 40mg  BID w/ meals -             - A1c 5.4, Cholestesrol 214, triglycerides 160, HDL 75, LDL 107;  QTC - Melatonin 3mg  for insomnia and stopped Trazodone due to daytime sedation - Encourage patient to interact with others in the milieu as well as attend group therapy sessions - SW consult for dispo planning including therapy and psychiatric outpatient care, patient endorsed that she is no longer able to see her outpatient therapist due to insurance  - discussed option of PHP/IOP after discharge and she would like to discuss with SW   Hyperlipidemia - will need to f/u with PCP after discharge   PGY-2 , MD 07/19/2021, 1:53 PM

## 2021-07-20 ENCOUNTER — Encounter (HOSPITAL_COMMUNITY): Payer: Self-pay

## 2021-07-20 NOTE — Progress Notes (Signed)
Pt denies SI/HI/AVH and verbally agrees to approach staff if these become apparent or before harming themselves/others. Rates depression 0/10. Rates anxiety 0/10. Rates pain 0/10. Pt complained on night shift of some movies giving her anxiety but there have been no complaints today. Pt stated that she has had a really good day. Scheduled medications administered to Pt, per MD orders. RN provided support and encouragement to Pt. Q15 min safety checks implemented and continued. Pt safe on the unit. RN will continue to monitor and intervene as needed.   07/20/21 0746  Psych Admission Type (Psych Patients Only)  Admission Status Voluntary  Psychosocial Assessment  Patient Complaints None  Eye Contact Brief  Facial Expression Flat;Sad  Affect Blunted;Sad  Speech Slow;Soft;Logical/coherent  Interaction Childlike;Guarded  Motor Activity Slow  Appearance/Hygiene Unremarkable  Behavior Characteristics Cooperative;Calm;Guarded  Mood Sad;Pleasant  Thought Process  Coherency Concrete thinking  Content WDL  Delusions None reported or observed  Perception WDL  Hallucination None reported or observed  Judgment Poor  Confusion None  Danger to Self  Current suicidal ideation? Denies  Danger to Others  Danger to Others None reported or observed  Danger to Others Abnormal  Harmful Behavior to others No threats or harm toward other people  Destructive Behavior No threats or harm toward property

## 2021-07-20 NOTE — Progress Notes (Addendum)
[facility] MD Progress Note  07/20/2021 5:37 PM Alejandria Wessells  MRN:  [id number] Subjective:  Megan Dunn is a 20 year old with a past psychiatric history of schizoaffective disorder who presented as a voluntary walking to [facility] endorsing worsening mood as well as auditory and visual hallucinations.   Case was discussed in the multidisciplinary team. MAR was reviewed and patient was compliant with medications.  She did not require any PRN's for agitation.   Psychiatric Team made the following recommendations yesterday:  - Continue Geodon to 40mg  BID w/ meals - titrate up as tolerated  On assessment this AM patient endorses that she is feeling better. Patient reports that she is sleeping and eating well. Patient reports that she has been talking with her BF about a safe discharge and he would like to take her back to [place] until her roommate is back in town. Patient reports that she really feels that the medication has helped her. Patient denies SI, HI and AVH.  Patient reports that she woke up to hearing knocking at her window, but was happy that she was able to use logic to quickly release the fears and recognized that it was likely the bush or her air conditioner making noise.  She denies ideas of reference or first rank symptoms and is a able to reality test today  Principal Problem: Schizoaffective disorder, bipolar type (HCC) Diagnosis: Principal Problem:   Schizoaffective disorder, bipolar type (HCC) Active Problems:   Borderline personality disorder (HCC)  Total Time Spent in Direct Patient Care:  I personally spent 20 minutes on the unit in direct patient care. The direct patient care time included face-to-face time with the patient, reviewing the patient's chart, communicating with other professionals, and coordinating care. Greater than 50% of this time was spent in counseling or coordinating care with the patient regarding goals of hospitalization, psycho-education, and discharge planning  needs.  Past Psychiatric History: See H&P  Past Medical History: History reviewed. No pertinent past medical history.  Past Surgical History:  Procedure Laterality Date   INCISION AND DRAINAGE ABSCESS N/A 01/23/2017   Procedure: DRAINAGE ABSCESS;  Surgeon: 01/25/2017, MD;  Location: [facility] OR;  Service: General;  Laterality: N/A;   LAPAROSCOPIC APPENDECTOMY N/A 01/18/2017   Procedure: APPENDECTOMY LAPAROSCOPIC;  Surgeon: 03/20/2017, MD;  Location: [facility] OR;  Service: General;  Laterality: N/A;   LAPAROTOMY N/A 01/23/2017   Procedure: EXPLORATORY LAPAROTOMY;  Surgeon: 01/25/2017, MD;  Location: [facility] OR;  Service: General;  Laterality: N/A;   LYSIS OF ADHESION N/A 01/23/2017   Procedure: LYSIS OF ADHESION;  Surgeon: 01/25/2017, MD;  Location: [facility] OR;  Service: General;  Laterality: N/A;   Family History:  Family History  Problem Relation Age of Onset   Aneurysm Mother        brain   Diabetes Maternal Grandmother    Stroke Maternal Grandfather    Family Psychiatric  History: See H&P Social History:  Social History   Substance and Sexual Activity  Alcohol Use No     Social History   Substance and Sexual Activity  Drug Use Not on file    Social History   Socioeconomic History   Marital status: Single    Spouse name: Not on file   Number of children: Not on file   Years of education: Not on file   Highest education level: Not on file  Occupational History   Not on file  Tobacco Use   Smoking status: Some Days  Packs/day: 0.25    Years: 1.00    Pack years: 0.25    Types: Cigarettes   Smokeless tobacco: Never  Vaping Use   Vaping Use: Every day  Substance and Sexual Activity   Alcohol use: No   Drug use: Not on file   Sexual activity: Not on file  Other Topics Concern   Not on file  Social History Narrative   Not on file   Social Determinants of Health   Financial Resource Strain: Not on file  Food Insecurity: Not on file  Transportation Needs: Not  on file  Physical Activity: Not on file  Stress: Not on file  Social Connections: Not on file     Sleep: Good  Appetite:  Good  Current Medications: Current Facility-Administered Medications  Medication Dose Route Frequency Provider Last Rate Last Admin   acetaminophen (TYLENOL) tablet 650 mg  650 mg Oral Q6H PRN Bobbitt, Shalon E, NP   650 mg at 07/18/21 0755   hydrOXYzine (ATARAX) tablet 25 mg  25 mg Oral TID PRN Bobbitt, Shalon E, NP   25 mg at 07/19/21 2112   magnesium hydroxide (MILK OF MAGNESIA) suspension 30 mL  30 mL Oral Daily PRN Leevy-Johnson, Brooke A, NP       melatonin tablet 3 mg  3 mg Oral QHS McQuilla, Gerlean Ren B, MD   3 mg at 07/19/21 2110   nicotine (NICODERM CQ - dosed in mg/24 hours) patch 21 mg  21 mg Transdermal Daily Mason Jim, Ranette Luckadoo E, MD   21 mg at 07/20/21 0747   Norgestimate-Ethinyl Estradiol Triphasic 0.18/0.215/0.25 MG-25 MCG tablet 1 tablet  1 tablet Oral Daily Massengill, Nathan, MD   1 tablet at 07/19/21 2110   ziprasidone (GEODON) capsule 40 mg  40 mg Oral BID WC Eliseo Gum B, MD   40 mg at 07/20/21 1635    Lab Results: No results found for this or any previous visit (from the past 48 hour(s)).  Blood Alcohol level:  No results found for: [facility]  Metabolic Disorder Labs: Lab Results  Component Value Date   HGBA1C 5.4 07/15/2021   MPG 108.28 07/15/2021   No results found for: PROLACTIN Lab Results  Component Value Date   CHOL 214 (H) 07/15/2021   TRIG 160 (H) 07/15/2021   HDL 75 07/15/2021   CHOLHDL 2.9 07/15/2021   VLDL 32 07/15/2021   LDLCALC 107 (H) 07/15/2021    Physical Findings: AIMS: Facial and Oral Movements Muscles of Facial Expression: None, normal Lips and Perioral Area: None, normal Jaw: None, normal Tongue: None, normal,Extremity Movements Upper (arms, wrists, hands, fingers): None, normal Lower (legs, knees, ankles, toes): None, normal, Trunk Movements Neck, shoulders, hips: None, normal, Overall Severity Severity of  abnormal movements (highest score from questions above): None, normal Incapacitation due to abnormal movements: None, normal Patient's awareness of abnormal movements (rate only patient's report): No Awareness, Dental Status Current problems with teeth and/or dentures?: No Does patient usually wear dentures?: No    Musculoskeletal: Strength & Muscle Tone: within normal limits Gait & Station: normal Patient leans: N/A  Psychiatric Specialty Exam:  Presentation  General Appearance:  casually dressed, pink hair, adequate hygiene  Eye Contact:Good  Speech:Clear and Coherent  Speech Volume:Normal   Mood and Affect  Mood:Euthymic  Affect:Appropriate   Thought Process  Thought Processes:Coherent  Descriptions of Associations:Intact  Orientation:Full (Time, Place and Person)  Thought Content:Logical -is able to reality test today and denies AVH; does not make delusional statements and denies ideas of  reference or first rank symptoms; patient is not grossly responding to any internal or external stimuli on exam  Hallucinations:Hallucinations: None  Ideas of Reference:None  Suicidal Thoughts:Suicidal Thoughts: No  Homicidal Thoughts:Homicidal Thoughts: No   Sensorium  Memory:Immediate Good; Recent Good  Judgment:-- (Improved)  Insight:Shallow   Executive Functions  Concentration:Fair  Attention Span:Fair  Recall:Fair Fund of Knowledge:Fair  Language:Good   Psychomotor Activity  Psychomotor Activity:Psychomotor Activity: Normal   Assets  Assets:Communication Skills; Housing; Social Support; Resilience   Sleep  time unrecorded  Physical Exam Constitutional:      Appearance: Normal appearance.  HENT:     Head: Normocephalic and atraumatic.  Pulmonary:     Effort: Pulmonary effort is normal.  Neurological:     Mental Status: She is alert and oriented to person, place, and time.   Review of Systems  Psychiatric/Behavioral:  Negative for  depression, hallucinations and suicidal ideas. The patient does not have insomnia.   Blood pressure (!) 123/92, pulse (!) 106, temperature 98.4 F (36.9 C), temperature source Oral, resp. rate 18, height 5\' 4"  (1.626 m), weight 74.4 kg, SpO2 99 %. Body mass index is 28.15 kg/m.   Treatment Plan Summary: Daily contact with patient to assess and evaluate symptoms and progress in treatment and Medication management Cloyce Paterson is a 20 year old patient with a past psychiatric history of schizoaffective disorder, bipolar type and borderline personality disorder. Patient appears to be close to her baseline. Patient judgement has significantly improved and patient does not appear to be endorsing adverse reactions to her medications.   Schizoaffective disorder, bipolar type by hx Borderline personality disorder by hx - Continue  Geodon to 40mg  BID w/ meals              - A1c 5.4, Cholestesrol 214, triglycerides 160, HDL 75, LDL 107;  QTC 26 - Melatonin 3mg  for insomnia and stopped Trazodone due to daytime sedation - Encourage patient to interact with others in the milieu as well as attend group therapy sessions - SW consult for dispo planning including therapy and psychiatric outpatient care, patient endorsed that she is no longer able to see her outpatient therapist due to insurance   - due to start PHP/IOP after discharge per SW next week   Hyperlipidemia - will need to f/u with PCP after discharge    PGY-2 , MD 07/20/2021, 5:37 PM

## 2021-07-20 NOTE — Progress Notes (Signed)
Patient did not attend the evening speaker AA meeting.  

## 2021-07-20 NOTE — Group Note (Signed)
LCSW Group Therapy Note   Group Date: 07/20/2021 Start Time: 1300 End Time: 1400  Topic: Coping Skills  Due to the acuity and a high number of new admissions, group was not held. Patient was provided therapeutic worksheets and asked to meet with CSW as needed.   Felizardo Hoffmann, LCSWA 07/20/2021  12:55 PM

## 2021-07-20 NOTE — BH IP Treatment Plan (Signed)
Interdisciplinary Treatment and Diagnostic Plan Update  07/20/2021 Aziah Kaiser MRN: [id number]  Principal Diagnosis: Schizoaffective disorder, bipolar type [facility])  Secondary Diagnoses: Principal Problem:   Schizoaffective disorder, bipolar type (HCC) Active Problems:   Borderline personality disorder (HCC)   Current Medications:  Current Facility-Administered Medications  Medication Dose Route Frequency Provider Last Rate Last Admin   acetaminophen (TYLENOL) tablet 650 mg  650 mg Oral Q6H PRN Bobbitt, Shalon E, NP   650 mg at 07/18/21 0755   hydrOXYzine (ATARAX) tablet 25 mg  25 mg Oral TID PRN Bobbitt, Shalon E, NP   25 mg at 07/19/21 2112   magnesium hydroxide (MILK OF MAGNESIA) suspension 30 mL  30 mL Oral Daily PRN Leevy-Johnson, Brooke A, NP       melatonin tablet 3 mg  3 mg Oral QHS McQuilla, Gerlean Ren B, MD   3 mg at 07/19/21 2110   nicotine (NICODERM CQ - dosed in mg/24 hours) patch 21 mg  21 mg Transdermal Daily Bartholomew Crews E, MD   21 mg at 07/20/21 0747   Norgestimate-Ethinyl Estradiol Triphasic 0.18/0.215/0.25 MG-25 MCG tablet 1 tablet  1 tablet Oral Daily Massengill, Nathan, MD   1 tablet at 07/19/21 2110   ziprasidone (GEODON) capsule 40 mg  40 mg Oral BID WC Eliseo Gum B, MD   40 mg at 07/20/21 0746   PTA Medications: Medications Prior to Admission  Medication Sig Dispense Refill Last Dose   albuterol (VENTOLIN HFA) 108 (90 Base) MCG/ACT inhaler Inhale 2 puffs into the lungs every 6 (six) hours as needed.      ARIPiprazole (ABILIFY) 15 MG tablet Take 1 tablet by mouth daily.      Norgestimate-Ethinyl Estradiol Triphasic 0.18/0.215/0.25 MG-25 MCG tab Take 1 tablet by mouth daily.       Patient Stressors: Marital or family conflict   Other: mental heatlh    Patient Strengths: Average or above average intelligence  Motivation for treatment/growth  Physical Health  Supportive family/friends   Treatment Modalities: Medication Management, Group therapy, Case management,   1 to 1 session with clinician, Psychoeducation, Recreational therapy.   Physician Treatment Plan for Primary Diagnosis: Schizoaffective disorder, bipolar type (HCC) Long Term Goal(s): Improvement in symptoms so as ready for discharge   Short Term Goals: Ability to identify changes in lifestyle to reduce recurrence of condition will improve Ability to verbalize feelings will improve Ability to disclose and discuss suicidal ideas Ability to demonstrate self-control will improve Ability to identify and develop effective coping behaviors will improve Ability to maintain clinical measurements within normal limits will improve Ability to identify triggers associated with substance abuse/mental health issues will improve  Medication Management: Evaluate patient's response, side effects, and tolerance of medication regimen.  Therapeutic Interventions: 1 to 1 sessions, Unit Group sessions and Medication administration.  Evaluation of Outcomes: Progressing  Physician Treatment Plan for Secondary Diagnosis: Principal Problem:   Schizoaffective disorder, bipolar type (HCC) Active Problems:   Borderline personality disorder (HCC)  Long Term Goal(s): Improvement in symptoms so as ready for discharge   Short Term Goals: Ability to identify changes in lifestyle to reduce recurrence of condition will improve Ability to verbalize feelings will improve Ability to disclose and discuss suicidal ideas Ability to demonstrate self-control will improve Ability to identify and develop effective coping behaviors will improve Ability to maintain clinical measurements within normal limits will improve Ability to identify triggers associated with substance abuse/mental health issues will improve     Medication Management: Evaluate patient's response, side effects,  and tolerance of medication regimen.  Therapeutic Interventions: 1 to 1 sessions, Unit Group sessions and Medication  administration.  Evaluation of Outcomes: Progressing   RN Treatment Plan for Primary Diagnosis: Schizoaffective disorder, bipolar type (HCC) Long Term Goal(s): Knowledge of disease and therapeutic regimen to maintain health will improve  Short Term Goals: Ability to participate in decision making will improve, Ability to verbalize feelings will improve, and Ability to identify and develop effective coping behaviors will improve  Medication Management: RN will administer medications as ordered by provider, will assess and evaluate patient's response and provide education to patient for prescribed medication. RN will report any adverse and/or side effects to prescribing provider.  Therapeutic Interventions: 1 on 1 counseling sessions, Psychoeducation, Medication administration, Evaluate responses to treatment, Monitor vital signs and CBGs as ordered, Perform/monitor CIWA, COWS, AIMS and Fall Risk screenings as ordered, Perform wound care treatments as ordered.  Evaluation of Outcomes: Progressing   LCSW Treatment Plan for Primary Diagnosis: Schizoaffective disorder, bipolar type (HCC) Long Term Goal(s): Safe transition to appropriate next level of care at discharge, Engage patient in therapeutic group addressing interpersonal concerns.  Short Term Goals: Engage patient in aftercare planning with referrals and resources, Increase social support, and Increase ability to appropriately verbalize feelings  Therapeutic Interventions: Assess for all discharge needs, 1 to 1 time with Social worker, Explore available resources and support systems, Assess for adequacy in community support network, Educate family and significant other(s) on suicide prevention, Complete Psychosocial Assessment, Interpersonal group therapy.  Evaluation of Outcomes: Progressing   Progress in Treatment: Attending groups: Yes. Participating in groups: Yes. Taking medication as prescribed: Yes. Toleration medication:  Yes. Family/Significant other contact made: Yes, individual(s) contacted:  boyfriend Patient understands diagnosis: Yes. Discussing patient identified problems/goals with staff: Yes. Medical problems stabilized or resolved: Yes. Denies suicidal/homicidal ideation: Yes. Issues/concerns per patient self-inventory: No. Other: None  New problem(s) identified: No, Describe:  None  New Short Term/Long Term Goal(s):medication stabilization, elimination of SI thoughts, development of comprehensive mental wellness plan.   Patient Goals: "to get my medications fixed and be able to work again."   Discharge Plan or Barriers: Pt will begin PHP at [facility] on 12/14.   Reason for Continuation of Hospitalization: Medication stabilization  Estimated Length of Stay: 3-5 days   Scribe for Treatment Team: Chrys Racer 07/20/2021 1:33 PM

## 2021-07-20 NOTE — Progress Notes (Signed)
   07/19/21 2305  Psych Admission Type (Psych Patients Only)  Admission Status Voluntary  Psychosocial Assessment  Patient Complaints Anxiety;Insomnia  Eye Contact Avertive;Brief  Facial Expression Flat;Sad  Affect Sad;Blunted  Speech Logical/coherent;Soft;Slow  Interaction Forwards little;Childlike  Motor Activity Slow  Appearance/Hygiene Unremarkable  Behavior Characteristics Cooperative;Calm  Mood Pleasant  Thought Process  Coherency Concrete thinking  Content WDL  Delusions None reported or observed  Perception WDL  Hallucination None reported or observed  Judgment Poor  Confusion None  Danger to Self  Current suicidal ideation? Denies  Danger to Others  Danger to Others None reported or observed  Danger to Others Abnormal  Harmful Behavior to others No threats or harm toward other people  Destructive Behavior No threats or harm toward property

## 2021-07-20 NOTE — BHH Counselor (Signed)
CSW spoke with the Pt who states that she would like information on where to apply for food stamps and unemployment.  CSW provided the Pt with print outs of information that informs the Pt of how to apply and where to go to apply for both services.

## 2021-07-20 NOTE — Group Note (Signed)
Recreation Therapy Group Note   Group Topic:Stress Management  Group Date: 07/20/2021 Start Time: 0930 End Time: 0949 Facilitators: Bjorn Loser, NT Location: [address] Dayroom   Goal Area(s) Addresses:  Patient will identify positive stress management techniques. Patient will identify benefits of using stress management post d/c.  Group Description:  Meditation.  LRT played a meditation that focused on setting boundaries with the people around you.  Meditation emphasized the point of saying no to things and the wishes of others is ok in order to put your feelings and desires first.     Affect/Mood: Appropriate   Participation Level: Active   Participation Quality: Independent   Behavior: Appropriate   Speech/Thought Process: Focused   Insight: Good   Judgement: Good   Modes of Intervention: Meditation   Patient Response to Interventions:  Engaged   Education Outcome:  Acknowledges education and In group clarification offered    Clinical Observations/Individualized Feedback: Pt attended and participated in group.    Plan: Continue to engage patient in RT group sessions 2-3x/week.   Megan Dunn, LRT,CTRS 07/20/2021 11:06 AM
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
## 2021-07-21 MED ORDER — NICOTINE 21 MG/24HR TD PT24: 21.0000 mg | MEDICATED_PATCH | Freq: Every day | TRANSDERMAL | 0 refills | Status: AC

## 2021-07-21 MED ORDER — HYDROXYZINE HCL 25 MG PO TABS: 25.0000 mg | ORAL_TABLET | Freq: Three times a day (TID) | ORAL | 0 refills | Status: AC | PRN

## 2021-07-21 MED ORDER — MELATONIN 3 MG PO TABS
3.0000 mg | ORAL_TABLET | Freq: Every day | ORAL | 0 refills | Status: AC
Start: 2021-07-21 — End: ?

## 2021-07-21 MED ORDER — ZIPRASIDONE HCL 40 MG PO CAPS: 40.0000 mg | ORAL_CAPSULE | Freq: Two times a day (BID) | ORAL | 0 refills | Status: AC

## 2021-07-21 NOTE — Progress Notes (Signed)
  [facility] Adult Case Management Discharge Plan :  Will you be returning to the same living situation after discharge:  No. Staying with boyfriend till room mate comes home At discharge, do you have transportation home?: Yes,  via boyfriend Do you have the ability to pay for your medications: Yes,  Medicaid  Release of information consent forms completed and in the chart;  Patient's signature needed at discharge.  Patient to Follow up at:  Follow-up Information     [facility] [place]. Go on 07/30/2021.   Why: You have a hospital follow up appointment for therapy and medication management services on 07/30/21 at 1:00 pm.  This appointment will be held in person. Contact information: [address] [place] [address] [phone]         [facility] Follow up on 07/25/2021.   Specialty: Behavioral Health Why: You have an assessment appointment for the partial hospitalization program on 07/25/21 at 10:00 am for intensive therapy and medication management services. Contact information: [address] [place] [address] [phone]                Next level of care provider has access to [facility] Link:yes  Safety Planning and Suicide Prevention discussed: Yes,  boyfriend     Has patient been referred to the Quitline?: Patient refused referral  Patient has been referred for addiction treatment: N/A  Felizardo Hoffmann, LCSWA 07/21/2021, 10:02 AM

## 2021-07-21 NOTE — Progress Notes (Signed)
Patient ID: Megan Dunn, female   DOB: 2000-11-30, 20 y.o.   MRN: [id number] Nursing discharge note: Patient discharged home per MD order.  Patient received all personal belongings from unit and locker.  Reviewed AVS/transition record with patient and she indicates understanding.  Patient will follow up with .  Patient denies any thoughts of self harm.  She left ambulatory with her boyfriend.

## 2021-07-21 NOTE — Discharge Summary (Signed)
Physician Discharge Summary Note  Patient:  Megan Dunn is an 20 y.o., female MRN:  [id number] DOB:  2000/09/19 Patient phone:  [phone] (home)  Patient address:   [address] [place] [address],  Total Time spent with patient: 30 minutes  Date of Admission:  07/14/2021 Date of Discharge: 07/21/21  Reason for Admission:  Worsening depression with auditory and visual hallucinations.   Principal Problem: Schizoaffective disorder, bipolar type [facility]) Discharge Diagnoses: Principal Problem:   Schizoaffective disorder, bipolar type (HCC) Active Problems:   Borderline personality disorder (HCC)   Past Psychiatric History: See H & P  Past Medical History: History reviewed. No pertinent past medical history.  Past Surgical History:  Procedure Laterality Date   INCISION AND DRAINAGE ABSCESS N/A 01/23/2017   Procedure: DRAINAGE ABSCESS;  Surgeon: Kandice Hams, MD;  Location: [facility] OR;  Service: General;  Laterality: N/A;   LAPAROSCOPIC APPENDECTOMY N/A 01/18/2017   Procedure: APPENDECTOMY LAPAROSCOPIC;  Surgeon: Kandice Hams, MD;  Location: [facility] OR;  Service: General;  Laterality: N/A;   LAPAROTOMY N/A 01/23/2017   Procedure: EXPLORATORY LAPAROTOMY;  Surgeon: Kandice Hams, MD;  Location: [facility] OR;  Service: General;  Laterality: N/A;   LYSIS OF ADHESION N/A 01/23/2017   Procedure: LYSIS OF ADHESION;  Surgeon: Kandice Hams, MD;  Location: [facility] OR;  Service: General;  Laterality: N/A;   Family History:  Family History  Problem Relation Age of Onset   Aneurysm Mother        brain   Diabetes Maternal Grandmother    Stroke Maternal Grandfather    Family Psychiatric  History: See H & P Social History:  Social History   Substance and Sexual Activity  Alcohol Use No     Social History   Substance and Sexual Activity  Drug Use Not on file    Social History   Socioeconomic History   Marital status: Single    Spouse name: Not on file   Number of children: Not  on file   Years of education: Not on file   Highest education level: Not on file  Occupational History   Not on file  Tobacco Use   Smoking status: Some Days    Packs/day: 0.25    Years: 1.00    Pack years: 0.25    Types: Cigarettes   Smokeless tobacco: Never  Vaping Use   Vaping Use: Every day  Substance and Sexual Activity   Alcohol use: No   Drug use: Not on file   Sexual activity: Not on file  Other Topics Concern   Not on file  Social History Narrative   Not on file   Social Determinants of Health   Financial Resource Strain: Not on file  Food Insecurity: Not on file  Transportation Needs: Not on file  Physical Activity: Not on file  Stress: Not on file  Social Connections: Not on file  History of Present Illness: Megan Dunn is a 20 year old with a past psychiatric history of schizoaffective disorder who presented as a voluntary walking to [facility] endorsing worsening mood as well as auditory and visual hallucinations.  Patient's boyfriend and sister came with patient and endorsed did not feel safe with patient at home.  Per walk-in documentation patient was also endorsing having on concerns about safety when she was left alone and endorsing having thoughts of self-harm.  Hospital Course:  During the patient's hospitalization, patient had extensive initial psychiatric evaluation, and follow-up psychiatric evaluations every day.  Psychiatric diagnoses provided upon initial assessment: Schizoaffective disorder, bipolar type ([place]).  During the hospitalization, other adjustments were made to the patient's psychiatric medication regimen:  Schizoaffective disorder, bipolar type by hx Borderline personality disorder by hx - Continue  Geodon to 40mg  BID w/ meals  - Melatonin 3mg  for insomnia and stopped Trazodone due to daytime sedation - Atarax 25 mg TID PRN Patient's care was discussed during the interdisciplinary team meeting every day during the hospitalization. The patient  denies having side effects to prescribed psychiatric medication.   Gradually, patient started adjusting to milieu. The patient was evaluated each day by a clinical provider to ascertain response to treatment. Improvement was noted by the patient's report of decreasing symptoms, improved sleep and appetite, affect, medication tolerance, behavior, and participation in unit programming.  Patient was asked each day to complete a self inventory noting mood, mental status, pain, new symptoms, anxiety and concerns.   Symptoms were reported as significantly decreased or resolved completely by discharge.  The patient reports that their mood is stable.  The patient denied having suicidal thoughts for more than 48 hours prior to discharge.  Patient denies having homicidal thoughts.  Patient denies having auditory hallucinations.  Patient denies any visual hallucinations or other symptoms of psychosis.  The patient was motivated to continue taking medication with a goal of continued improvement in mental health.    The patient reports their target psychiatric symptoms of mood disorder and auditory/visual hallucinations responded well to the psychiatric medications, and the patient reports overall benefit other psychiatric hospitalization. Supportive psychotherapy was provided to the patient. The patient also participated in regular group therapy while hospitalized. Coping skills, problem solving as well as relaxation therapies were also part of the unit programming.   Labs were reviewed with the patient, and abnormal results were discussed with the patient.   The patient is able to verbalize their individual safety plan to this provider.   # It is recommended to the patient to continue psychiatric medications as prescribed, after discharge from the hospital.     # It is recommended to the patient to follow up with your outpatient psychiatric provider and PCP.   # It was discussed with the patient, the impact  of alcohol, drugs, tobacco have been there overall psychiatric and medical wellbeing, and total abstinence from substance use was recommended the patient.ed.   # Prescriptions provided or sent directly to preferred pharmacy at discharge. Patient agreeable to plan. Given opportunity to ask questions. Appears to feel comfortable with discharge.    # In the event of worsening symptoms, the patient is instructed to call the crisis hotline, 911 and or go to the nearest ED for appropriate evaluation and treatment of symptoms. To follow-up with primary care provider for other medical issues, concerns and or health care needs   # Patient was discharged home with a plan to follow up as noted below.   Physical Findings: AIMS: Facial and Oral Movements Muscles of Facial Expression: None, normal Lips and Perioral Area: None, normal Jaw: None, normal Tongue: None, normal,Extremity Movements Upper (arms, wrists, hands, fingers): None, normal Lower (legs, knees, ankles, toes): None, normal, Trunk Movements Neck, shoulders, hips: None, normal, Overall Severity Severity of abnormal movements (highest score from questions above): None, normal Incapacitation due to abnormal movements: None, normal Patient's awareness of abnormal movements (rate only patient's report): No Awareness, Dental Status Current problems with teeth and/or dentures?: No Does patient usually wear dentures?: No  CIWA:    COWS:  Musculoskeletal: Strength & Muscle Tone: within normal limits Gait & Station: normal Patient leans: N/A   Psychiatric Specialty Exam:  Presentation  General Appearance: Appropriate for Environment; Fairly Groomed  Eye Contact:Fair  Speech:Clear and Coherent  Speech Volume:Normal  Handedness:Right   Mood and Affect  Mood:Euthymic  Affect:Appropriate   Thought Process  Thought Processes:Coherent  Descriptions of Associations:Intact  Orientation:Full (Time, Place and  Person)  Thought Content:Logical  History of Schizophrenia/Schizoaffective disorder:No data recorded Duration of Psychotic Symptoms:No data recorded Hallucinations:Hallucinations: None  Ideas of Reference:None  Suicidal Thoughts:Suicidal Thoughts: No  Homicidal Thoughts:Homicidal Thoughts: No   Sensorium  Memory:Immediate Good  Judgment:Good  Insight:Good   Executive Functions  Concentration:Good  Attention Span:Good  [place] of Knowledge:Good  Language:Good   Psychomotor Activity  Psychomotor Activity:Psychomotor Activity: Normal   Assets  Assets:Communication Skills   Sleep  Sleep:Sleep: Good    Physical Exam: Physical Exam Constitutional:      General: She is not in acute distress. HENT:     Head: Normocephalic.     Nose: No congestion or rhinorrhea.     Mouth/Throat:     Mouth: Mucous membranes are moist.  Eyes:     General:        Right eye: No discharge.        Left eye: No discharge.  Cardiovascular:     Rate and Rhythm: Normal rate.  Pulmonary:     Effort: No respiratory distress.  Chest:     Chest wall: No tenderness.  Abdominal:     General: There is no distension.  Musculoskeletal:        General: No swelling.     Cervical back: No rigidity.  Neurological:     Mental Status: She is alert and oriented to person, place, and time.     Sensory: No sensory deficit.     Coordination: Coordination normal.  Psychiatric:        Mood and Affect: Mood normal.        Behavior: Behavior normal.        Thought Content: Thought content normal.        Judgment: Judgment normal.   Review of Systems  Constitutional:  Negative for chills, fever and malaise/fatigue.  HENT:  Negative for congestion and sore throat.   Respiratory:  Negative for cough and shortness of breath.   Cardiovascular:  Negative for chest pain and palpitations.  Gastrointestinal:  Negative for abdominal pain, constipation, diarrhea, heartburn, nausea and  vomiting.  Genitourinary:  Negative for frequency and urgency.  Musculoskeletal:  Negative for back pain, joint pain, myalgias and neck pain.  Skin:  Negative for rash.  Neurological:  Negative for dizziness, tremors, loss of consciousness and headaches.  Psychiatric/Behavioral:  Negative for depression, hallucinations, substance abuse and suicidal ideas. The patient is not nervous/anxious and does not have insomnia.   Blood pressure 115/79, pulse 94, temperature 98.4 F (36.9 C), temperature source Oral, resp. rate 18, height 5\' 4"  (1.626 m), weight 74.4 kg, SpO2 99 %. Body mass index is 28.15 kg/m.   Social History   Tobacco Use  Smoking Status Some Days   Packs/day: 0.25   Years: 1.00   Pack years: 0.25   Types: Cigarettes  Smokeless Tobacco Never   Tobacco Cessation:  A prescription for an FDA-approved tobacco cessation medication provided at discharge   Blood Alcohol level:  No results found for: [facility]  Metabolic Disorder Labs:  Lab Results  Component Value Date   HGBA1C 5.4 07/15/2021  MPG 108.28 07/15/2021   No results found for: PROLACTIN Lab Results  Component Value Date   CHOL 214 (H) 07/15/2021   TRIG 160 (H) 07/15/2021   HDL 75 07/15/2021   CHOLHDL 2.9 07/15/2021   VLDL 32 07/15/2021   LDLCALC 107 (H) 07/15/2021    See Psychiatric Specialty Exam and Suicide Risk Assessment completed by Attending Physician prior to discharge.  Discharge destination:  Home  Is patient on multiple antipsychotic therapies at discharge:  No   Has Patient had three or more failed trials of antipsychotic monotherapy by history:  No  Recommended Plan for Multiple Antipsychotic Therapies: NA  Discharge Instructions     [facility] pharmacy consult for medication samples   Complete by: As directed    Please provide patient with 7 day supply of medications.   Diet - low sodium heart healthy   Complete by: As directed    Increase activity slowly   Complete by: As directed        Allergies as of 07/21/2021   No Known Allergies      Medication List     STOP taking these medications    ARIPiprazole 15 MG tablet Commonly known as: ABILIFY   Norgestimate-Ethinyl Estradiol Triphasic 0.18/0.215/0.25 MG-25 MCG tab       TAKE these medications      Indication  albuterol 108 (90 Base) MCG/ACT inhaler Commonly known as: VENTOLIN HFA Inhale 2 puffs into the lungs every 6 (six) hours as needed.  Indication: Asthma   hydrOXYzine 25 MG tablet Commonly known as: ATARAX Take 1 tablet (25 mg total) by mouth 3 (three) times daily as needed for anxiety (sleep). For Anxiety  Indication: Feeling Anxious   melatonin 3 MG Tabs tablet Take 1 tablet (3 mg total) by mouth at bedtime. For sleep  Indication: Trouble Sleeping   nicotine 21 mg/24hr patch Commonly known as: NICODERM CQ - dosed in mg/24 hours Place 1 patch (21 mg total) onto the skin daily. For smoking cessation Start taking on: July 22, 2021  Indication: Nicotine Addiction   ziprasidone 40 MG capsule Commonly known as: GEODON Take 1 capsule (40 mg total) by mouth 2 (two) times daily with a meal. Bipolar mood disorder  Indication: Manic-Depression        Signed: Nicholes Rough, NP 07/21/2021, 10:06 AM

## 2021-07-21 NOTE — Progress Notes (Signed)
Pt in stable mood. Pt interacted positively with staff and peers during shift. Pt verbalized being anxious and excited to being discharged soon. States she plans to have a fun weekend with friends. Denies SI/HI. Denies AVH.     07/20/21 1950  Psych Admission Type (Psych Patients Only)  Admission Status Voluntary  Psychosocial Assessment  Patient Complaints Anxiety  Eye Contact Fair  Facial Expression Anxious  Affect Anxious;Appropriate to circumstance  Speech Logical/coherent  Interaction Guarded  Motor Activity Other (Comment) (WNL)  Appearance/Hygiene Unremarkable  Behavior Characteristics Appropriate to situation;Cooperative;Calm;Anxious  Mood Pleasant  Thought Process  Coherency Concrete thinking  Content WDL  Delusions None reported or observed  Perception WDL  Hallucination None reported or observed  Judgment Limited  Confusion None  Danger to Self  Current suicidal ideation? Denies  Danger to Others  Danger to Others None reported or observed  Danger to Others Abnormal  Harmful Behavior to others No threats or harm toward other people  Destructive Behavior No threats or harm toward property

## 2021-07-21 NOTE — Progress Notes (Signed)
Adult Psychoeducational Group Note  Date:  07/21/2021 Time:  10:57 AM  Group Topic/Focus:  Goals Group:   The focus of this group is to help patients establish daily goals to achieve during treatment and discuss how the patient can incorporate goal setting into their daily lives to aide in recovery.  Participation Level:  Active  Participation Quality:  Appropriate  Affect:  Appropriate  Cognitive:  Appropriate  Insight: Appropriate  Engagement in Group:  Engaged  Modes of Intervention:  Discussion  Additional Comments: Patient attended goals group and participated.   Remy Voiles W Jamaury Gumz 07/21/2021, 10:57 AM

## 2021-07-21 NOTE — [facility] Suicide Risk Assessment (Cosign Needed)
Suicide Risk Assessment  Discharge Assessment    [facility] Discharge Suicide Risk Assessment   Principal Problem: Schizoaffective disorder, bipolar type [facility]) Discharge Diagnoses: Principal Problem:   Schizoaffective disorder, bipolar type ([place]) Active Problems:   Borderline personality disorder ([place])   Total Time spent with patient: 30 minutes History of Present Illness: Megan Dunn is a 20 year old with a past psychiatric history of schizoaffective disorder who presented as a voluntary walking to [facility] endorsing worsening mood as well as auditory and visual hallucinations.  Patient's boyfriend and sister came with patient and endorsed did not feel safe with patient at home.  Per walk-in documentation patient was also endorsing having on concerns about safety when she was left alone and endorsing having thoughts of self-harm.  HOSPITAL COURSE:  During the patient's hospitalization, patient had extensive initial psychiatric evaluation, and follow-up psychiatric evaluations every day.  Psychiatric diagnoses provided upon initial assessment: Schizoaffective disorder, bipolar type ([place])  During the hospitalization, other adjustments were made to the patient's psychiatric medication regimen:  Schizoaffective disorder, bipolar type by hx Borderline personality disorder by hx - Continue  Geodon to 40mg  BID w/ meals  - Melatonin 3mg  for insomnia and stopped Trazodone due to daytime sedation - Atarax 25 mg TID PRN Patient's care was discussed during the interdisciplinary team meeting every day during the hospitalization. The patient denies having side effects to prescribed psychiatric medication.  Gradually, patient started adjusting to milieu. The patient was evaluated each day by a clinical provider to ascertain response to treatment. Improvement was noted by the patient's report of decreasing symptoms, improved sleep and appetite, affect, medication tolerance, behavior, and participation in unit  programming.  Patient was asked each day to complete a self inventory noting mood, mental status, pain, new symptoms, anxiety and concerns.   Symptoms were reported as significantly decreased or resolved completely by discharge.  The patient reports that their mood is stable.  The patient denied having suicidal thoughts for more than 48 hours prior to discharge.  Patient denies having homicidal thoughts.  Patient denies having auditory hallucinations.  Patient denies any visual hallucinations or other symptoms of psychosis.  The patient was motivated to continue taking medication with a goal of continued improvement in mental health.   The patient reports their target psychiatric symptoms of mood disorder and auditory/visual hallucinations responded well to the psychiatric medications, and the patient reports overall benefit other psychiatric hospitalization. Supportive psychotherapy was provided to the patient. The patient also participated in regular group therapy while hospitalized. Coping skills, problem solving as well as relaxation therapies were also part of the unit programming.  Labs were reviewed with the patient, and abnormal results were discussed with the patient.  The patient is able to verbalize their individual safety plan to this provider.  # It is recommended to the patient to continue psychiatric medications as prescribed, after discharge from the hospital.    # It is recommended to the patient to follow up with your outpatient psychiatric provider and PCP.  # It was discussed with the patient, the impact of alcohol, drugs, tobacco have been there overall psychiatric and medical wellbeing, and total abstinence from substance use was recommended the patient.ed.  # Prescriptions provided or sent directly to preferred pharmacy at discharge. Patient agreeable to plan. Given opportunity to ask questions. Appears to feel comfortable with discharge.    # In the event of worsening  symptoms, the patient is instructed to call the crisis hotline, 911 and or go to the  nearest ED for appropriate evaluation and treatment of symptoms. To follow-up with primary care provider for other medical issues, concerns and or health care needs  # Patient was discharged home with a plan to follow up as noted below.   Musculoskeletal: Strength & Muscle Tone: within normal limits Gait & Station: normal Patient leans: N/A  Psychiatric Specialty Exam  Presentation  General Appearance: Appropriate for Environment  Eye Contact:Good  Speech:Clear and Coherent  Speech Volume:Normal  Handedness:Right  Mood and Affect  Mood:Euthymic  Duration of Depression Symptoms: No data recorded Affect:Appropriate  Thought Process  Thought Processes:Coherent  Descriptions of Associations:Intact  Orientation:Full (Time, Place and Person)  Thought Content:Logical  History of Schizophrenia/Schizoaffective disorder:No data recorded Duration of Psychotic Symptoms:No data recorded Hallucinations:Hallucinations: None  Ideas of Reference:None  Suicidal Thoughts:Suicidal Thoughts: No  Homicidal Thoughts:Homicidal Thoughts: No  Sensorium  Memory:Immediate Good  Judgment:Good  Insight:Good  Executive Functions  Concentration:Good  Attention Span:Good  [place] of Knowledge:Good  Language:Good  Psychomotor Activity  Psychomotor Activity:Psychomotor Activity: Normal  Assets  Assets:Communication Skills  Sleep  Sleep:Sleep: Good  Physical Exam: Physical Exam Constitutional:      General: She is not in acute distress.    Appearance: Normal appearance.  HENT:     Head: Normocephalic.     Nose: No congestion or rhinorrhea.     Mouth/Throat:     Pharynx: No oropharyngeal exudate or posterior oropharyngeal erythema.  Eyes:     General:        Right eye: No discharge.        Left eye: No discharge.  Pulmonary:     Effort: No respiratory distress.   Musculoskeletal:        General: No swelling or tenderness.     Cervical back: No rigidity.  Skin:    Findings: No rash.  Neurological:     Cranial Nerves: No cranial nerve deficit.     Sensory: No sensory deficit.  Psychiatric:        Mood and Affect: Mood normal.        Behavior: Behavior normal.        Thought Content: Thought content normal.        Judgment: Judgment normal.   Review of Systems  Constitutional:  Negative for chills, fever and malaise/fatigue.  HENT:  Negative for congestion, ear discharge, hearing loss and sore throat.   Eyes: Negative.   Respiratory:  Negative for cough, hemoptysis, shortness of breath and wheezing.   Cardiovascular:  Negative for chest pain, palpitations and leg swelling.  Gastrointestinal:  Negative for abdominal pain, constipation, diarrhea, heartburn, nausea and vomiting.  Genitourinary:  Negative for frequency.  Musculoskeletal:  Negative for back pain, myalgias and neck pain.  Skin:  Negative for itching and rash.  Neurological:  Negative for tingling, tremors, seizures, loss of consciousness, weakness and headaches.  Psychiatric/Behavioral:  Negative for depression, hallucinations, substance abuse and suicidal ideas. The patient is not nervous/anxious and does not have insomnia.   Blood pressure 115/79, pulse 94, temperature 98.4 F (36.9 C), temperature source Oral, resp. rate 18, height 5\' 4"  (1.626 m), weight 74.4 kg, SpO2 99 %. Body mass index is 28.15 kg/m.  Mental Status Per Nursing Assessment::   On Admission:  Suicidal ideation indicated by patient, Self-harm behaviors  Demographic Factors:  NA  Loss Factors: NA  Historical Factors: Impulsivity  Risk Reduction Factors:   Positive social support  Continued Clinical Symptoms:  N/A  Cognitive Features That Contribute To Risk:  None  Suicide Risk:  Minimal: No identifiable suicidal ideation.  Patients presenting with no risk factors but with morbid ruminations;  may be classified as minimal risk based on the severity of the depressive symptoms   Follow-up Information     [facility]. Go on 07/30/2021.   Why: You have a hospital follow up appointment for therapy and medication management services on 07/30/21 at 1:00 pm.  This appointment will be held in person. Contact information: [address] [place] [address] [phone]         [facility] Follow up on 07/25/2021.   Specialty: Behavioral Health Why: You have an assessment appointment for the partial hospitalization program on 07/25/21 at 10:00 am for intensive therapy and medication management services. Contact information: [address] [place] [address] [phone]                Starleen Blue, NP 07/21/2021, 9:51 AM

## 2021-07-23 NOTE — [facility] Group Notes (Signed)
   Spiritual care group on grief and loss facilitated by chaplain Dyanne Carrel, [facility]   Group Goal:   Support / Education around grief and loss   Members engage in facilitated group support and psycho-social education.   Group Description:   Following introductions and group rules, group members engaged in facilitated group dialog and support around topic of loss, with particular support around experiences of loss in their lives. Group Identified types of loss (relationships / self / things) and identified patterns, circumstances, and changes that precipitate losses. Reflected on thoughts / feelings around loss, normalized grief responses, and recognized variety in grief experience. Group noted Worden's four tasks of grief in discussion.   Group drew on Adlerian / Rogerian, narrative, [place],   Patient Progress: Participated and engaged in conversation. Shared about the loss of her mother as well as other losses.  [address], [facility] Pager, [phone]

## 2021-07-25 ENCOUNTER — Telehealth (HOSPITAL_COMMUNITY): Payer: Self-pay | Admitting: Professional

## 2021-07-25 ENCOUNTER — Ambulatory Visit (HOSPITAL_COMMUNITY): Payer: Medicaid Other
